# Patient Record
Sex: Female | Born: 1940
Health system: Southern US, Community
[De-identification: ages and names within clinical notes are randomized; demographics above are authoritative.]

## PROBLEM LIST (undated history)

## (undated) DIAGNOSIS — H269 Unspecified cataract: Secondary | ICD-10-CM

## (undated) DIAGNOSIS — M1990 Unspecified osteoarthritis, unspecified site: Secondary | ICD-10-CM

## (undated) DIAGNOSIS — N189 Chronic kidney disease, unspecified: Secondary | ICD-10-CM

## (undated) HISTORY — DX: Unspecified cataract: H26.9

## (undated) HISTORY — PX: CATARACT EXTRACTION: SUR2

---

## 1998-07-06 ENCOUNTER — Other Ambulatory Visit: Admission: RE | Admit: 1998-07-06 | Discharge: 1998-07-06 | Payer: Self-pay | Admitting: Obstetrics & Gynecology

## 1999-09-30 ENCOUNTER — Other Ambulatory Visit: Admission: RE | Admit: 1999-09-30 | Discharge: 1999-09-30 | Payer: Self-pay | Admitting: Obstetrics & Gynecology

## 2000-01-10 ENCOUNTER — Encounter: Payer: Self-pay | Admitting: Internal Medicine

## 2000-01-10 ENCOUNTER — Ambulatory Visit (HOSPITAL_COMMUNITY): Admission: RE | Admit: 2000-01-10 | Discharge: 2000-01-10 | Payer: Self-pay | Admitting: Internal Medicine

## 2000-11-28 ENCOUNTER — Other Ambulatory Visit: Admission: RE | Admit: 2000-11-28 | Discharge: 2000-11-28 | Payer: Self-pay | Admitting: Obstetrics & Gynecology

## 2001-09-26 ENCOUNTER — Encounter (INDEPENDENT_AMBULATORY_CARE_PROVIDER_SITE_OTHER): Payer: Self-pay | Admitting: Specialist

## 2001-09-26 ENCOUNTER — Ambulatory Visit (HOSPITAL_COMMUNITY): Admission: RE | Admit: 2001-09-26 | Discharge: 2001-09-26 | Payer: Self-pay | Admitting: Orthopedic Surgery

## 2001-12-18 ENCOUNTER — Other Ambulatory Visit: Admission: RE | Admit: 2001-12-18 | Discharge: 2001-12-18 | Payer: Self-pay | Admitting: Obstetrics & Gynecology

## 2002-02-06 ENCOUNTER — Ambulatory Visit (HOSPITAL_COMMUNITY): Admission: RE | Admit: 2002-02-06 | Discharge: 2002-02-06 | Payer: Self-pay | Admitting: Gastroenterology

## 2003-02-23 ENCOUNTER — Other Ambulatory Visit: Admission: RE | Admit: 2003-02-23 | Discharge: 2003-02-23 | Payer: Self-pay | Admitting: Obstetrics & Gynecology

## 2003-07-07 ENCOUNTER — Ambulatory Visit (HOSPITAL_COMMUNITY): Admission: RE | Admit: 2003-07-07 | Discharge: 2003-07-07 | Payer: Self-pay | Admitting: Orthopaedic Surgery

## 2003-07-07 ENCOUNTER — Ambulatory Visit (HOSPITAL_BASED_OUTPATIENT_CLINIC_OR_DEPARTMENT_OTHER): Admission: RE | Admit: 2003-07-07 | Discharge: 2003-07-07 | Payer: Self-pay | Admitting: Orthopaedic Surgery

## 2004-04-06 ENCOUNTER — Ambulatory Visit: Payer: Self-pay | Admitting: Internal Medicine

## 2004-07-07 ENCOUNTER — Other Ambulatory Visit: Admission: RE | Admit: 2004-07-07 | Discharge: 2004-07-07 | Payer: Self-pay | Admitting: Obstetrics & Gynecology

## 2004-07-15 ENCOUNTER — Ambulatory Visit: Payer: Self-pay | Admitting: Internal Medicine

## 2007-05-14 ENCOUNTER — Ambulatory Visit: Payer: Self-pay | Admitting: Internal Medicine

## 2007-05-14 DIAGNOSIS — J019 Acute sinusitis, unspecified: Secondary | ICD-10-CM | POA: Insufficient documentation

## 2007-05-14 DIAGNOSIS — E785 Hyperlipidemia, unspecified: Secondary | ICD-10-CM | POA: Insufficient documentation

## 2007-10-03 ENCOUNTER — Ambulatory Visit: Payer: Self-pay | Admitting: Internal Medicine

## 2007-10-03 DIAGNOSIS — M1990 Unspecified osteoarthritis, unspecified site: Secondary | ICD-10-CM

## 2007-10-03 DIAGNOSIS — J309 Allergic rhinitis, unspecified: Secondary | ICD-10-CM | POA: Insufficient documentation

## 2007-10-03 DIAGNOSIS — H9190 Unspecified hearing loss, unspecified ear: Secondary | ICD-10-CM | POA: Insufficient documentation

## 2007-10-12 ENCOUNTER — Ambulatory Visit: Payer: Self-pay | Admitting: Family Medicine

## 2007-10-12 DIAGNOSIS — B0239 Other herpes zoster eye disease: Secondary | ICD-10-CM | POA: Insufficient documentation

## 2007-11-01 ENCOUNTER — Ambulatory Visit: Payer: Self-pay | Admitting: Internal Medicine

## 2007-11-01 DIAGNOSIS — R21 Rash and other nonspecific skin eruption: Secondary | ICD-10-CM

## 2007-11-21 ENCOUNTER — Telehealth: Payer: Self-pay | Admitting: Internal Medicine

## 2008-01-10 ENCOUNTER — Ambulatory Visit: Payer: Self-pay | Admitting: Internal Medicine

## 2008-01-10 DIAGNOSIS — R51 Headache: Secondary | ICD-10-CM

## 2008-01-10 DIAGNOSIS — R519 Headache, unspecified: Secondary | ICD-10-CM | POA: Insufficient documentation

## 2008-01-13 LAB — CONVERTED CEMR LAB
BUN: 18 mg/dL (ref 6–23)
Basophils Absolute: 0 10*3/uL (ref 0.0–0.1)
Basophils Relative: 0.8 % (ref 0.0–3.0)
Bilirubin Urine: NEGATIVE
CO2: 29 meq/L (ref 19–32)
Calcium: 8.9 mg/dL (ref 8.4–10.5)
Chloride: 104 meq/L (ref 96–112)
Creatinine, Ser: 1 mg/dL (ref 0.4–1.2)
Eosinophils Absolute: 0.1 10*3/uL (ref 0.0–0.7)
Eosinophils Relative: 1.7 % (ref 0.0–5.0)
GFR calc Af Amer: 71 mL/min
GFR calc non Af Amer: 58 mL/min
Glucose, Bld: 86 mg/dL (ref 70–99)
HCT: 36.7 % (ref 36.0–46.0)
Hemoglobin: 12.8 g/dL (ref 12.0–15.0)
Ketones, ur: NEGATIVE mg/dL
Leukocytes, UA: NEGATIVE
Lymphocytes Relative: 33.2 % (ref 12.0–46.0)
MCHC: 34.9 g/dL (ref 30.0–36.0)
MCV: 88.9 fL (ref 78.0–100.0)
Monocytes Absolute: 0.7 10*3/uL (ref 0.1–1.0)
Monocytes Relative: 12.9 % — ABNORMAL HIGH (ref 3.0–12.0)
Neutro Abs: 2.7 10*3/uL (ref 1.4–7.7)
Neutrophils Relative %: 51.4 % (ref 43.0–77.0)
Nitrite: NEGATIVE
Platelets: 216 10*3/uL (ref 150–400)
Potassium: 3.8 meq/L (ref 3.5–5.1)
RBC: 4.13 M/uL (ref 3.87–5.11)
RDW: 13.5 % (ref 11.5–14.6)
Sed Rate: 18 mm/hr (ref 0–22)
Sodium: 140 meq/L (ref 135–145)
Specific Gravity, Urine: <=1.005 (ref 1.000–1.03)
TSH: 0.96 microintl units/mL (ref 0.35–5.50)
Total Protein, Urine: NEGATIVE mg/dL
Urine Glucose: NEGATIVE mg/dL
Urobilinogen, UA: 0.2 (ref 0.0–1.0)
WBC: 5.3 10*3/uL (ref 4.5–10.5)
pH: 6 (ref 5.0–8.0)

## 2008-03-25 ENCOUNTER — Encounter: Payer: Self-pay | Admitting: Internal Medicine

## 2008-05-29 ENCOUNTER — Ambulatory Visit: Payer: Self-pay | Admitting: Internal Medicine

## 2008-05-29 DIAGNOSIS — M79609 Pain in unspecified limb: Secondary | ICD-10-CM | POA: Insufficient documentation

## 2008-05-29 DIAGNOSIS — M25559 Pain in unspecified hip: Secondary | ICD-10-CM | POA: Insufficient documentation

## 2008-05-29 DIAGNOSIS — N3090 Cystitis, unspecified without hematuria: Secondary | ICD-10-CM | POA: Insufficient documentation

## 2008-06-02 LAB — CONVERTED CEMR LAB
BUN: 32 mg/dL — ABNORMAL HIGH (ref 6–23)
Basophils Absolute: 0 10*3/uL (ref 0.0–0.1)
Basophils Relative: 0.1 % (ref 0.0–3.0)
CO2: 27 meq/L (ref 19–32)
Calcium: 9.2 mg/dL (ref 8.4–10.5)
Chloride: 102 meq/L (ref 96–112)
Creatinine, Ser: 1.2 mg/dL (ref 0.4–1.2)
Crystals: NEGATIVE
Eosinophils Absolute: 0.1 10*3/uL (ref 0.0–0.7)
Eosinophils Relative: 1.8 % (ref 0.0–5.0)
Folate: 19 ng/mL
GFR calc Af Amer: 57 mL/min
GFR calc non Af Amer: 47 mL/min
Glucose, Bld: 80 mg/dL (ref 70–99)
HCT: 40 % (ref 36.0–46.0)
Hemoglobin: 13.9 g/dL (ref 12.0–15.0)
Ketones, ur: >=80 mg/dL — AB
Leukocytes, UA: NEGATIVE
Lymphocytes Relative: 30.6 % (ref 12.0–46.0)
MCHC: 34.8 g/dL (ref 30.0–36.0)
MCV: 89.7 fL (ref 78.0–100.0)
Monocytes Absolute: 0.5 10*3/uL (ref 0.1–1.0)
Monocytes Relative: 10.5 % (ref 3.0–12.0)
Neutro Abs: 2.9 10*3/uL (ref 1.4–7.7)
Neutrophils Relative %: 57 % (ref 43.0–77.0)
Nitrite: NEGATIVE
Platelets: 210 10*3/uL (ref 150–400)
Potassium: 4.8 meq/L (ref 3.5–5.1)
RBC: 4.46 M/uL (ref 3.87–5.11)
RDW: 12.7 % (ref 11.5–14.6)
Sed Rate: 18 mm/hr (ref 0–22)
Sodium: 138 meq/L (ref 135–145)
Specific Gravity, Urine: >=1.03 (ref 1.000–1.03)
TSH: 0.95 microintl units/mL (ref 0.35–5.50)
Total Protein, Urine: NEGATIVE mg/dL
Urine Glucose: NEGATIVE mg/dL
Urobilinogen, UA: 0.2 (ref 0.0–1.0)
Vitamin B-12: 272 pg/mL (ref 211–911)
WBC: 5.1 10*3/uL (ref 4.5–10.5)
pH: 5 (ref 5.0–8.0)

## 2008-06-04 ENCOUNTER — Telehealth: Payer: Self-pay | Admitting: Internal Medicine

## 2008-06-18 ENCOUNTER — Ambulatory Visit: Payer: Self-pay | Admitting: Internal Medicine

## 2008-07-06 ENCOUNTER — Ambulatory Visit: Payer: Self-pay | Admitting: Internal Medicine

## 2008-07-06 ENCOUNTER — Telehealth: Payer: Self-pay | Admitting: Internal Medicine

## 2008-07-06 LAB — CONVERTED CEMR LAB
Bilirubin Urine: NEGATIVE
Ketones, ur: NEGATIVE mg/dL
Leukocytes, UA: NEGATIVE
Nitrite: NEGATIVE
Specific Gravity, Urine: 1.025 (ref 1.000–1.03)
Total Protein, Urine: NEGATIVE mg/dL
Urine Glucose: NEGATIVE mg/dL
Urobilinogen, UA: 0.2 (ref 0.0–1.0)
pH: 5 (ref 5.0–8.0)

## 2008-07-07 ENCOUNTER — Telehealth: Payer: Self-pay | Admitting: Internal Medicine

## 2008-07-31 ENCOUNTER — Telehealth: Payer: Self-pay | Admitting: Internal Medicine

## 2008-07-31 ENCOUNTER — Ambulatory Visit: Payer: Self-pay | Admitting: Internal Medicine

## 2008-08-04 ENCOUNTER — Encounter: Payer: Self-pay | Admitting: Internal Medicine

## 2008-08-04 LAB — CONVERTED CEMR LAB
Bilirubin Urine: NEGATIVE
Ketones, ur: NEGATIVE mg/dL
Nitrite: NEGATIVE
Specific Gravity, Urine: >=1.03 (ref 1.000–1.030)
Total Protein, Urine: NEGATIVE mg/dL
Urine Glucose: NEGATIVE mg/dL
Urobilinogen, UA: 0.2 (ref 0.0–1.0)
pH: 5.5 (ref 5.0–8.0)

## 2008-09-25 ENCOUNTER — Ambulatory Visit: Payer: Self-pay | Admitting: Internal Medicine

## 2009-01-27 ENCOUNTER — Ambulatory Visit: Payer: Self-pay | Admitting: Internal Medicine

## 2009-01-27 LAB — CONVERTED CEMR LAB: Vit D, 25-Hydroxy: 35 ng/mL (ref 30–89)

## 2009-01-31 LAB — CONVERTED CEMR LAB
BUN: 20 mg/dL (ref 6–23)
CO2: 26 meq/L (ref 19–32)
Calcium: 8.9 mg/dL (ref 8.4–10.5)
Chloride: 105 meq/L (ref 96–112)
Creatinine, Ser: 1.1 mg/dL (ref 0.4–1.2)
GFR calc non Af Amer: 52.16 mL/min (ref >60)
Glucose, Bld: 78 mg/dL (ref 70–99)
Potassium: 4.2 meq/L (ref 3.5–5.1)
Sodium: 139 meq/L (ref 135–145)
Vitamin B-12: 291 pg/mL (ref 211–911)

## 2009-02-01 ENCOUNTER — Ambulatory Visit: Payer: Self-pay | Admitting: Internal Medicine

## 2009-02-01 DIAGNOSIS — E538 Deficiency of other specified B group vitamins: Secondary | ICD-10-CM

## 2009-02-01 DIAGNOSIS — R002 Palpitations: Secondary | ICD-10-CM | POA: Insufficient documentation

## 2010-03-07 ENCOUNTER — Ambulatory Visit: Payer: Self-pay | Admitting: Internal Medicine

## 2010-03-07 DIAGNOSIS — M545 Low back pain, unspecified: Secondary | ICD-10-CM | POA: Insufficient documentation

## 2010-03-07 LAB — CONVERTED CEMR LAB
Anti Nuclear Antibody(ANA): NEGATIVE
Rhuematoid fact SerPl-aCnc: <20 intl units/mL (ref 0–20)

## 2010-03-09 LAB — CONVERTED CEMR LAB
Basophils Absolute: 0.1 10*3/uL (ref 0.0–0.1)
Basophils Relative: 1.6 % (ref 0.0–3.0)
Eosinophils Absolute: 0.1 10*3/uL (ref 0.0–0.7)
Eosinophils Relative: 3.4 % (ref 0.0–5.0)
HCT: 38.4 % (ref 36.0–46.0)
Hemoglobin: 13.2 g/dL (ref 12.0–15.0)
Lymphocytes Relative: 31.4 % (ref 12.0–46.0)
Lymphs Abs: 1.3 10*3/uL (ref 0.7–4.0)
MCHC: 34.5 g/dL (ref 30.0–36.0)
MCV: 91.2 fL (ref 78.0–100.0)
Monocytes Absolute: 0.4 10*3/uL (ref 0.1–1.0)
Monocytes Relative: 10.1 % (ref 3.0–12.0)
Neutro Abs: 2.3 10*3/uL (ref 1.4–7.7)
Neutrophils Relative %: 53.5 % (ref 43.0–77.0)
Platelets: 186 10*3/uL (ref 150.0–400.0)
RBC: 4.22 M/uL (ref 3.87–5.11)
RDW: 14 % (ref 11.5–14.6)
Sed Rate: 14 mm/hr (ref 0–22)
Vitamin B-12: 825 pg/mL (ref 211–911)
WBC: 4.2 10*3/uL — ABNORMAL LOW (ref 4.5–10.5)

## 2010-03-15 ENCOUNTER — Encounter: Payer: Self-pay | Admitting: Internal Medicine

## 2010-05-23 ENCOUNTER — Telehealth: Payer: Self-pay | Admitting: Internal Medicine

## 2010-05-23 ENCOUNTER — Other Ambulatory Visit: Payer: Self-pay | Admitting: Internal Medicine

## 2010-05-23 ENCOUNTER — Ambulatory Visit
Admission: RE | Admit: 2010-05-23 | Discharge: 2010-05-23 | Payer: Self-pay | Source: Home / Self Care | Attending: Internal Medicine | Admitting: Internal Medicine

## 2010-05-23 LAB — URINALYSIS, ROUTINE W REFLEX MICROSCOPIC
Bilirubin Urine: NEGATIVE
Ketones, ur: NEGATIVE
Nitrite: NEGATIVE
Specific Gravity, Urine: 1.02 (ref 1.000–1.030)
Total Protein, Urine: NEGATIVE
Urine Glucose: NEGATIVE
Urobilinogen, UA: 0.2 (ref 0.0–1.0)
pH: 6 (ref 5.0–8.0)

## 2010-06-14 NOTE — Assessment & Plan Note (Signed)
Summary: PAINFUL ARTHRITIS/#/CD   Vital Signs:  Patient profile:   70 year old female Height:      64 inches Weight:      136 pounds BMI:     23.43 Temp:     97.8 degrees F oral Pulse rate:   76 / minute Pulse rhythm:   regular Resp:     16 per minute BP sitting:   112 / 70  (left arm) Cuff size:   regular  Vitals Entered By: Lanier Prude, Beverly Gust) (March 07, 2010 9:47 AM) CC: arthritis pain worse in hips Is Patient Diabetic? No Comments pt is not taking Triamcinolone or Pennsaid   CC:  arthritis pain worse in hips.  History of Present Illness: C/o arthralgia and stiffness in hips, LBP 10/10 x 6 months. Much less so with shoulders and neck.  Current Medications (verified): 1)  Premarin 0.9 Mg  Tabs (Estrogens Conjugated) .Marland Kitchen.. 1 By Mouth Qd 2)  Fexofenadine Hcl 180 Mg Tabs (Fexofenadine Hcl) .Marland Kitchen.. 1 Once Daily As Needed Allergies 3)  Fluticasone Propionate 50 Mcg/act  Susp (Fluticasone Propionate) .... 2 Sprays Each Nostril Once Daily 4)  Triamcinolone Acetonide 0.5 % Crea (Triamcinolone Acetonide) .... Apply Bid To Affected Area 5)  Vitamin B-12 Cr 1000 Mcg  Tbcr (Cyanocobalamin) .... Take One Tablet By Mouth Daily 6)  Vitamin D3 1000 Unit  Tabs (Cholecalciferol) .Marland Kitchen.. 1 By Mouth Daily 7)  Aleve 220 Mg Tabs (Naproxen Sodium) .... Two Times A Day 8)  Pennsaid 1.5 % Soln (Diclofenac Sodium) .... 3-5 Gtt To Skin Three Times Daily  Allergies (verified): 1)  ! Tetracycline 2)  ! Macrodantin  Past History:  Past Medical History: Last updated: 02/01/2009 Hyperlipidemia Osteoarthritis Dr Jennette Kettle Allergic rhinitis L face H zoster 2009 (?), more likely D. herpetiformis - resolved on Gluten free diet Low Vit B12 2009 and low Vit D  Social History: Last updated: 02/01/2009 Never Smoked Alcohol use-yes Married Regular exercise-yes - golf and stretching  Past Surgical History: Hysterectomy - complete s/p hand cyst  Review of Systems  The patient denies fever,  dyspnea on exertion, and abdominal pain.    Physical Exam  General:  F/u on hip pain and rash on face Head:  tender over L max sinus no pulsating vessels Nose:  External nasal examination shows no deformity or inflammation. Nasal mucosa are pink and moist without lesions or exudates. Mouth:  Oral mucosa and oropharynx without lesions or exudates.  Teeth in good repair. Neck:  No deformities, masses, or tenderness noted. Lungs:  Normal respiratory effort, chest expands symmetrically. Lungs are clear to auscultation, no crackles or wheezes. Heart:  Normal rate and regular rhythm. S1 and S2 normal without gallop, murmur, click, rub or other extra sounds. Abdomen:  Bowel sounds positive,abdomen soft and non-tender without masses, organomegaly or hernias noted. Msk:  hips  B tender Lumbar-sacral spine is tender to palpation over paraspinal muscles and painfull with the ROM  groin is tender B Pulses:  R and L carotid,radial,femoral,dorsalis pedis and posterior tibial pulses are full and equal bilaterally Neurologic:  Strai leg elev (-) B Skin:  Clear Psych:  Cognition and judgment appear intact. Alert and cooperative with normal attention span and concentration. No apparent delusions, illusions, hallucinations   Impression & Recommendations:  Problem # 1:  HIP PAIN (ICD-719.45) B MSK, r/o CTD Assessment Deteriorated  The following medications were removed from the medication list:    Aleve 220 Mg Tabs (Naproxen sodium) .Marland Kitchen..Marland Kitchen Two times a day Her  updated medication list for this problem includes:    Naproxen 500 Mg Tabs (Naproxen) .Marland Kitchen... 1 by mouth two times a day pc for pain/arthritis    Tramadol Hcl 50 Mg Tabs (Tramadol hcl) .Marland Kitchen... 1-2 tabs by mouth two times a day as needed pain  Orders: TLB-B12, Serum-Total ONLY (56213-Y86) TLB-Sedimentation Rate (ESR) (85652-ESR) Antinuclear Antib (ANA) [phone]) T-RA (Rheumatoid Factor) ([id number]) TLB-CBC Platelet - w/Differential  (85025-CBCD) Rheumatology Referral (Rheumatology)  Problem # 2:  LOW BACK PAIN, ACUTE (ICD-724.2) Assessment: Deteriorated  The following medications were removed from the medication list:    Aleve 220 Mg Tabs (Naproxen sodium) .Marland Kitchen..Marland Kitchen Two times a day Her updated medication list for this problem includes:    Naproxen 500 Mg Tabs (Naproxen) .Marland Kitchen... 1 by mouth two times a day pc for pain/arthritis    Tramadol Hcl 50 Mg Tabs (Tramadol hcl) .Marland Kitchen... 1-2 tabs by mouth two times a day as needed pain  Orders: Antinuclear Antib (ANA) [phone]) T-RA (Rheumatoid Factor) [phone]) Rheumatology Referral (Rheumatology) TLB-B12, Serum-Total ONLY (38756-E33) TLB-Sedimentation Rate (ESR) (85652-ESR) TLB-CBC Platelet - w/Differential (85025-CBCD)  Problem # 3:  VITAMIN B12 DEFICIENCY (ICD-266.2) Assessment: Unchanged On the regimen of medicine(s) reflected in the chart   Orders: Antinuclear Antib (ANA) [phone]) T-RA (Rheumatoid Factor) (06301-60109) TLB-B12, Serum-Total ONLY (32355-D32) TLB-Sedimentation Rate (ESR) (85652-ESR) TLB-CBC Platelet - w/Differential (85025-CBCD)  Complete Medication List: 1)  Premarin 0.9 Mg Tabs (Estrogens conjugated) .Marland Kitchen.. 1 by mouth qd 2)  Fexofenadine Hcl 180 Mg Tabs (Fexofenadine hcl) .Marland Kitchen.. 1 once daily as needed allergies 3)  Fluticasone Propionate 50 Mcg/act Susp (Fluticasone propionate) .... 2 sprays each nostril once daily 4)  Triamcinolone Acetonide 0.5 % Crea (Triamcinolone acetonide) .... Apply bid to affected area 5)  Vitamin B-12 Cr 1000 Mcg Tbcr (Cyanocobalamin) .... Take one tablet by mouth daily 6)  Vitamin D3 1000 Unit Tabs (Cholecalciferol) .Marland Kitchen.. 1 by mouth daily 7)  Pennsaid 1.5 % Soln (Diclofenac sodium) .... 3-5 gtt to skin three times daily 8)  Naproxen 500 Mg Tabs (Naproxen) .Marland Kitchen.. 1 by mouth two times a day pc for pain/arthritis 9)  Amoxicillin 500 Mg Caps (Amoxicillin) .... 2 caps by mouth bid 10)  Tramadol Hcl 50 Mg Tabs (Tramadol hcl)  .Marland Kitchen.. 1-2 tabs by mouth two times a day as needed pain  Patient Instructions: 1)  Please schedule a follow-up appointment in 1 month. 2)  Go on Youtube ([email or web address]) and look up "piriformis stretch", "Ileopsoas stretch", "IT band stretch" and "gluteus stretch". See the anatomy and learn the symptoms.  You can try to self-diagnose. Do the stretches - it may help!  Prescriptions: TRAMADOL HCL 50 MG TABS (TRAMADOL HCL) 1-2 tabs by mouth two times a day as needed pain  #120 x 3   Entered and Authorized by:   Tresa Garter MD   Signed by:   Tresa Garter MD on 03/07/2010   Method used:   Print then Give to Patient   RxID:   [id number] AMOXICILLIN 500 MG CAPS (AMOXICILLIN) 2 caps by mouth bid  #40 x 0   Entered and Authorized by:   Tresa Garter MD   Signed by:   Tresa Garter MD on 03/07/2010   Method used:   Print then Give to Patient   RxID:   [id number] NAPROXEN 500 MG TABS (NAPROXEN) 1 by mouth two times a day pc for pain/arthritis  #60 x 3   Entered and Authorized by:   Tresa Garter MD   Signed by:  Tresa Garter MD on 03/07/2010   Method used:   Print then Give to Patient   RxID:   [id number]    Orders Added: 1)  Antinuclear Antib (ANA) [[id number]] 2)  T-RA (Rheumatoid Factor) [30865-78469] 3)  Est. Patient Level IV [62952] 4)  Rheumatology Referral [Rheumatology] 5)  TLB-B12, Serum-Total ONLY [82607-B12] 6)  TLB-Sedimentation Rate (ESR) [85652-ESR] 7)  TLB-CBC Platelet - w/Differential [85025-CBCD]   Not Administered:    Influenza Vaccine not given due to: declined

## 2010-06-14 NOTE — Consult Note (Signed)
Summary: Stacey Drain MD  Stacey Drain MD   Imported By: Lennie Odor 03/30/2010 10:13:11  _____________________________________________________________________  External Attachment:    Type:   Image     Comment:   External Document

## 2010-06-16 NOTE — Progress Notes (Signed)
Summary: ?bladder inf  Phone Note Call from Patient Call back at Home Phone [phone]   Caller: Patient Call For: Tresa Garter MD Summary of Call: Pt requests to come in today for urine specimen for possible bladder infection?  Initial call taken by: Verdell Face,  May 23, 2010 8:45 AM  Follow-up for Phone Call        ok UA 595.0 Follow-up by: Tresa Garter MD,  May 23, 2010 1:09 PM  Additional Follow-up for Phone Call Additional follow up Details #1::        pt informed/order entered in IDX. Additional Follow-up by: Lanier Prude, [facility]),  May 23, 2010 1:14 PM

## 2010-09-27 NOTE — Assessment & Plan Note (Signed)
[facility]                                 ON-CALL NOTE   Amanda, Cole                        MRN:          [id number]  DATE:10/12/2007                            DOB:          07-Dec-1940    PHONE NUMBER:  [phone].   PRIMARY PHYSICIAN:  Georgina Quint. Plotnikov, MD   SUBJECTIVE:  Rash on forehead, possible shingles.   ASSESSMENT AND PLAN:  [facility].     Kerby Nora, MD  Electronically Signed    AB/MedQ  DD: 10/12/2007  DT: 10/12/2007  Job #: [id number]

## 2010-09-30 NOTE — Op Note (Signed)
[facility]  Patient:    Amanda Cole, Amanda Cole Visit Number: [id number] MRN: [id number]          Service Type: DSU Location: DAY Attending Physician:  Marlowe Kays Page Dictated by:   Illene Labrador. Aplington, M.D. Proc. Date: 09/26/01 Admit Date:  09/26/2001                             Operative Report  PREOPERATIVE DIAGNOSIS:  Painful Dupuytren nodules, left palm.  POSTOPERATIVE DIAGNOSIS:  Painful Dupuytren nodules, left palm.  OPERATION PERFORMED:  Excision of multiple Dupuytrens nodules, left palm.  SURGEON:  Illene Labrador. Aplington, M.D.  ASSISTANT:  Nurse.  ANESTHESIA:  General.  PATHOLOGY AND JUSTIFICATION FOR PROCEDURE:  She had one nodule in particular which has been painful for her with several other areas of prominent thickening. She has been unable to use the hand for activities such as golf because of this.  DESCRIPTION OF PROCEDURE:  Satisfactory general anesthesia, pneumatic tourniquet with the arm esmarched out and the hand and wrist prepped with Duraprep and draped in a sterile field. I started out with an incision essentially elongating the distal portion of a carpal tunnel incision and was able to excise all the nodules which were to either side of the incision through the approach without having to go to any flaps. The underlying tendinous and neurovascular structures were identified and carefully protected and the palmar fibromatosis dissected out mainly with hemostat and then the areas of fibromatosis cut out with applying scissors. I worked on both sides of the incision and at the conclusion of the case, all areas that appeared to be a problem preoperatively had been excised. Along the way, potential bleeders were coagulated with bipolar cautery. The wound was then irrigated with sterile saline and the wound margins infiltrated with 0.5% plain Marcaine. The skin and subcutaneous tissue only were closed with interrupted 4-0 nylon  mattress sutures. Betadine Adaptic and a small compressive dressing were applied. The tourniquet was released, she tolerated the procedure well and at the time of this dictation was on her way to the recovery room in satisfactory condition with no known complications. Dictated by:   Illene Labrador. Aplington, M.D. Attending Physician:  Joaquin Courts DD:  09/26/01 TD:  09/27/01 Job: [id number]

## 2010-09-30 NOTE — Op Note (Signed)
NAMEFRAN, NEISWONGER                           ACCOUNT NO.:  [id number]   MEDICAL RECORD NO.:  [id number]                   PATIENT TYPE:  AMB   LOCATION:  DSC                                  FACILITY:  [facility]   PHYSICIAN:  Claude Manges. Cleophas Dunker, M.D.            DATE OF BIRTH:  [birth year]   DATE OF PROCEDURE:  07/07/2003  DATE OF DISCHARGE:                                 OPERATIVE REPORT   PREOPERATIVE DIAGNOSIS:  Impingement, right shoulder.   POSTOPERATIVE DIAGNOSIS:  Impingement, right shoulder with SLAP 2 lesion of  anterior glenoid labrum.   OPERATION PERFORMED:   SURGEON:  Claude Manges. Cleophas Dunker, M.D.   ASSISTANT:   ANESTHESIA:  General orotracheal anesthesia with supplemental interscalene  nerve block.   COMPLICATIONS:  None.   INDICATIONS FOR PROCEDURE:  The patient is a 70 year old female who has been  experiencing right shoulder pain for many months.  She has been evaluated  with a clinical diagnosis of impingement.  She has not responded to  subacromial cortisone injections and physical therapy and continues to have  pain to the point of compromise.  She has had difficulty sleeping and  performing activities overhead.  She had had an MRI scan that reveals  partial surface tearing of the distal supraspinatus tendon and subscapularis  tendinosis.  There was no evidence of a full thickness rotator cuff tear.  There was a type 2 acromion with moderate lateral downsloping.  She is now  to have arthroscopic evaluation.   DESCRIPTION OF PROCEDURE:  With the patient comfortable on the operating  table under general orotracheal anesthesia with a supplemental interscalene  nerve block.  The patient was placed in a semisitting position with a  shoulder frame.  The right shoulder which would have been appropriately  identified as the operative site preoperatively was prepped with DuraPrep  from the base of the neck circumferentially below the elbow.  Sterile  draping was  performed.   A marking pin was used to outline the acromion, the acromioclavicular joint  and the coracoid.  At a point a fingerbreadth posterior and medial to the  posterior angle of the acromion, a small stab wound was made prior to which  0.25% Marcaine with epinephrine was injected.  The arthroscope was easily  placed into the shoulder joint with no evidence of any loose material or  significant synovitis.  There was no chondromalacia of the glenoid at the  humeral head.  The biceps tendon appeared to be intact.  I did not see  evidence of a partial or full rotator cuff tear.  There was, however, a SLAP  2 lesion of the anterior glenoid labrum, extending from about the 2 o'clock  position to the biceps anchor.  It was easily mobile from the glenoid  labrum.  Accordingly a repair of the anterior glenoid labrum was performed.  The 6 mm cannula was then inserted through a separate  stab wound anteriorly.  The Arthrex tissue tack was utilized.  The 4 mm Vortex bur was inserted to  roughen the bone surface at the anterior glenoid and the tissue tack  instruments were then carefully inserted.  A drill was then placed over a  guide pin and the 30 degree angle tack was then placed through the very  center of the repair.  I had an excellent repair, it was nice and tight and  the entire labrum was easily juxtaposed to the eburnated glenoid.  I  carefully probed it and it was perfectly stable.  There was no evidence of  any further abnormality.   The cannula was then removed and a 4 mm cannula placed in the subacromial  space anteriorly. A third portal was established in the lateral subacromial  space.  An anterior inferior acromioplasty was performed associated with the  subacromial decompression through the arthroscope.  There was obvious  overhang of the anterolateral acromion impinging the supraspinatus and part  of the infraspinatus tendon.  These were carefully debrided so that I had a   very nice space and no further impingement.  I do see evidence of a full  thickness rotator cuff tear.  The subacromial space was carefully irrigated  with saline solution.  Three puncture sites were closed with interrupted 4-0  Ethilon and infiltrated with 0.25% Marcaine with epinephrine.  Sterile bulky  dressing was applied followed by a sling.   PLAN:  Percocet for pain. Outpatient office one week.                                               Claude Manges. Cleophas Dunker, M.D.    PWW/MEDQ  D:  07/07/2003  T:  07/07/2003  Job:  [id number]

## 2011-03-13 ENCOUNTER — Other Ambulatory Visit: Payer: Self-pay | Admitting: Internal Medicine

## 2011-05-30 ENCOUNTER — Telehealth: Payer: Self-pay

## 2011-05-30 NOTE — Telephone Encounter (Signed)
Ok UA Take OTC Azo prn Thx

## 2011-05-30 NOTE — Telephone Encounter (Signed)
Pt called c/o urinary urgency. Pt is requesting order for UA, okay to enter?

## 2011-08-31 NOTE — Telephone Encounter (Signed)
UA performed and resulted... Routed U/A result to Dr. Posey Rea.

## 2012-02-29 ENCOUNTER — Ambulatory Visit (INDEPENDENT_AMBULATORY_CARE_PROVIDER_SITE_OTHER): Payer: Medicare Other | Admitting: Internal Medicine

## 2012-02-29 ENCOUNTER — Encounter: Payer: Self-pay | Admitting: Internal Medicine

## 2012-02-29 VITALS — BP 122/80 | HR 76 | Temp 97.3°F | Resp 16 | Wt 136.0 lb

## 2012-02-29 DIAGNOSIS — J019 Acute sinusitis, unspecified: Secondary | ICD-10-CM

## 2012-02-29 DIAGNOSIS — Z2911 Encounter for prophylactic immunotherapy for respiratory syncytial virus (RSV): Secondary | ICD-10-CM

## 2012-02-29 DIAGNOSIS — Z0000 Encounter for general adult medical examination without abnormal findings: Secondary | ICD-10-CM | POA: Insufficient documentation

## 2012-02-29 DIAGNOSIS — M79609 Pain in unspecified limb: Secondary | ICD-10-CM

## 2012-02-29 DIAGNOSIS — Z23 Encounter for immunization: Secondary | ICD-10-CM

## 2012-02-29 DIAGNOSIS — M79642 Pain in left hand: Secondary | ICD-10-CM | POA: Insufficient documentation

## 2012-02-29 DIAGNOSIS — E538 Deficiency of other specified B group vitamins: Secondary | ICD-10-CM

## 2012-02-29 DIAGNOSIS — M1990 Unspecified osteoarthritis, unspecified site: Secondary | ICD-10-CM

## 2012-02-29 DIAGNOSIS — E785 Hyperlipidemia, unspecified: Secondary | ICD-10-CM

## 2012-02-29 DIAGNOSIS — J309 Allergic rhinitis, unspecified: Secondary | ICD-10-CM

## 2012-02-29 MED ORDER — AMOXICILLIN 500 MG PO CAPS: 1000.0000 mg | ORAL_CAPSULE | Freq: Two times a day (BID) | ORAL | Status: DC

## 2012-02-29 MED ORDER — VITAMIN B-12 1000 MCG SL SUBL: 1.0000 | SUBLINGUAL_TABLET | Freq: Every day | SUBLINGUAL | Status: DC

## 2012-02-29 MED ORDER — ESTROGENS CONJUGATED 0.625 MG PO TABS: 0.6250 mg | ORAL_TABLET | Freq: Every day | ORAL | Status: DC

## 2012-02-29 MED ORDER — FLUTICASONE PROPIONATE 50 MCG/ACT NA SUSP: 1.0000 | Freq: Every day | NASAL | Status: DC

## 2012-02-29 MED ORDER — VITAMIN D 1000 UNITS PO TABS: 1000.0000 [IU] | ORAL_TABLET | Freq: Every day | ORAL | Status: AC

## 2012-02-29 MED ORDER — NAPROXEN SODIUM 220 MG PO TABS: 220.0000 mg | ORAL_TABLET | Freq: Two times a day (BID) | ORAL | Status: DC

## 2012-02-29 NOTE — Progress Notes (Signed)
  Subjective:    Patient ID: Amanda Cole, female    DOB: [birth year], 71 y.o.   MRN: [id number]  HPI  The patient is here for a wellness exam. The patient has been doing well overall without major physical or psychological issues going on lately.  BP Readings from Last 3 Encounters:  02/29/12 122/80  03/07/10 112/70  02/01/09 120/64   Wt Readings from Last 3 Encounters:  02/29/12 136 lb (61.689 kg)  03/07/10 136 lb (61.689 kg)  02/01/09 139 lb (63.05 kg)       Review of Systems  Constitutional: Negative for chills, activity change, appetite change, fatigue and unexpected weight change.  HENT: Positive for congestion. Negative for mouth sores and sinus pressure.   Eyes: Negative for visual disturbance.  Respiratory: Negative for cough and chest tightness.   Gastrointestinal: Negative for nausea and abdominal pain.  Genitourinary: Negative for frequency, difficulty urinating and vaginal pain.  Musculoskeletal: Positive for arthralgias. Negative for back pain and gait problem.  Skin: Negative for pallor and rash.  Neurological: Negative for dizziness, tremors, weakness, numbness and headaches.  Psychiatric/Behavioral: Negative for suicidal ideas, confusion and disturbed wake/sleep cycle.       Objective:   Physical Exam  Constitutional: She appears well-developed. No distress.  HENT:  Head: Normocephalic.  Right Ear: External ear normal.  Left Ear: External ear normal.  Nose: Nose normal.  Mouth/Throat: Oropharynx is clear and moist.  Eyes: Conjunctivae normal are normal. Pupils are equal, round, and reactive to light. Right eye exhibits no discharge. Left eye exhibits no discharge.  Neck: Normal range of motion. Neck supple. No JVD present. No tracheal deviation present. No thyromegaly present.  Cardiovascular: Normal rate, regular rhythm and normal heart sounds.   Pulmonary/Chest: No stridor. No respiratory distress. She has no wheezes.  Abdominal: Soft. Bowel sounds  are normal. She exhibits no distension and no mass. There is no tenderness. There is no rebound and no guarding.  Musculoskeletal: She exhibits tenderness. She exhibits no edema.       Fingers w/OA deformities and L thumb base tenderness  Lymphadenopathy:    She has no cervical adenopathy.  Neurological: She displays normal reflexes. No cranial nerve deficit. She exhibits normal muscle tone. Coordination normal.  Skin: No rash noted. No erythema.  Psychiatric: She has a normal mood and affect. Her behavior is normal. Judgment and thought content normal.    Lab Results  Component Value Date   WBC 4.2* 03/07/2010   HGB 13.2 03/07/2010   HCT 38.4 03/07/2010   PLT 186.0 03/07/2010   GLUCOSE 78 01/27/2009   NA 139 01/27/2009   K 4.2 01/27/2009   CL 105 01/27/2009   CREATININE 1.1 01/27/2009   BUN 20 01/27/2009   CO2 26 01/27/2009   TSH 0.95 05/29/2008         Assessment & Plan:

## 2012-02-29 NOTE — Assessment & Plan Note (Signed)
10/13 abd poll longus tendonitis Injection was offered - she will think about it

## 2012-02-29 NOTE — Assessment & Plan Note (Signed)
Continue with current prescription therapy as reflected on the Med list.  

## 2012-02-29 NOTE — Assessment & Plan Note (Signed)

## 2012-02-29 NOTE — Assessment & Plan Note (Signed)
Amoxicillin bid x 10 d

## 2012-02-29 NOTE — Assessment & Plan Note (Signed)
Continue with current OTC therapy as reflected on the Med list.  

## 2012-02-29 NOTE — Assessment & Plan Note (Signed)
Declined statins. 

## 2012-02-29 NOTE — Assessment & Plan Note (Signed)
OA, finger deforming - B hands Naproxen prn

## 2012-03-01 ENCOUNTER — Ambulatory Visit: Payer: Self-pay | Admitting: Internal Medicine

## 2012-03-29 ENCOUNTER — Other Ambulatory Visit (INDEPENDENT_AMBULATORY_CARE_PROVIDER_SITE_OTHER): Payer: Medicare Other

## 2012-03-29 DIAGNOSIS — E785 Hyperlipidemia, unspecified: Secondary | ICD-10-CM

## 2012-03-29 DIAGNOSIS — E538 Deficiency of other specified B group vitamins: Secondary | ICD-10-CM

## 2012-03-29 DIAGNOSIS — J309 Allergic rhinitis, unspecified: Secondary | ICD-10-CM

## 2012-03-29 DIAGNOSIS — J019 Acute sinusitis, unspecified: Secondary | ICD-10-CM

## 2012-03-29 DIAGNOSIS — M1990 Unspecified osteoarthritis, unspecified site: Secondary | ICD-10-CM

## 2012-03-29 DIAGNOSIS — Z0000 Encounter for general adult medical examination without abnormal findings: Secondary | ICD-10-CM

## 2012-03-29 DIAGNOSIS — Z23 Encounter for immunization: Secondary | ICD-10-CM

## 2012-03-29 LAB — URINALYSIS
Bilirubin Urine: NEGATIVE
Hgb urine dipstick: NEGATIVE
Ketones, ur: NEGATIVE
Total Protein, Urine: NEGATIVE
Urine Glucose: NEGATIVE
Urobilinogen, UA: 0.2 (ref 0.0–1.0)

## 2012-03-29 LAB — HEPATIC FUNCTION PANEL
ALT: 14 U/L (ref 0–35)
AST: 19 U/L (ref 0–37)
Albumin: 3.6 g/dL (ref 3.5–5.2)
Alkaline Phosphatase: 53 U/L (ref 39–117)
Total Protein: 6.8 g/dL (ref 6.0–8.3)

## 2012-03-29 LAB — BASIC METABOLIC PANEL
BUN: 17 mg/dL (ref 6–23)
Calcium: 8.6 mg/dL (ref 8.4–10.5)
GFR: 51.16 mL/min — ABNORMAL LOW (ref >60.00)
Glucose, Bld: 85 mg/dL (ref 70–99)
Sodium: 137 mEq/L (ref 135–145)

## 2012-03-29 LAB — CBC WITH DIFFERENTIAL/PLATELET
Basophils Relative: 0.9 % (ref 0.0–3.0)
Eosinophils Absolute: 0.1 10*3/uL (ref 0.0–0.7)
Eosinophils Relative: 2.5 % (ref 0.0–5.0)
HCT: 37.4 % (ref 36.0–46.0)
Lymphs Abs: 1.3 10*3/uL (ref 0.7–4.0)
MCHC: 33.1 g/dL (ref 30.0–36.0)
MCV: 90.3 fl (ref 78.0–100.0)
Monocytes Absolute: 0.5 10*3/uL (ref 0.1–1.0)
Neutrophils Relative %: 57.8 % (ref 43.0–77.0)
Platelets: 183 10*3/uL (ref 150.0–400.0)
WBC: 4.6 10*3/uL (ref 4.5–10.5)

## 2012-03-29 LAB — URIC ACID: Uric Acid, Serum: 6.9 mg/dL (ref 2.4–7.0)

## 2012-03-29 LAB — LIPID PANEL: Cholesterol: 271 mg/dL — ABNORMAL HIGH (ref 0–200)

## 2012-05-02 ENCOUNTER — Telehealth: Payer: Self-pay | Admitting: *Deleted

## 2012-05-02 NOTE — Telephone Encounter (Signed)
Rec fax stating Premarin 0.625 mg will no longer be covered effective 01.01.2014. Alts include Alendronate, Paroxetine and Venlafaxine. Please advise which is appropriate.

## 2012-05-03 NOTE — Telephone Encounter (Signed)
Listed below meds have nothing in common with premarin. She needs to call them for a covered premarin alternative. Alendronate is for osteoporosis and the other two are antidepressants used off label for hot flashes Thx

## 2012-05-03 NOTE — Telephone Encounter (Signed)
Pt aware.

## 2012-07-31 ENCOUNTER — Telehealth: Payer: Self-pay | Admitting: Internal Medicine

## 2012-07-31 NOTE — Telephone Encounter (Signed)
Amanda Cole / Pt calling in regard to Premarin Rx.  Caller went to get refill med and the pharmacy needs pre authorization.  Caller states pharmacy has sent request for authorization.  Please follow up with caller/pharmacy regarding script.  [employer] confirmed in EMR.  THANK YOU.

## 2012-08-19 NOTE — Telephone Encounter (Signed)
Completed PA form faxed on 08/16/12. Will wait for ins response.

## 2012-08-26 NOTE — Telephone Encounter (Signed)
Called [phone] opt 3 then opt 1 to check PA status. Unable to speak to a person. Will try again later.

## 2012-11-29 ENCOUNTER — Other Ambulatory Visit: Payer: Self-pay | Admitting: Rheumatology

## 2012-11-29 DIAGNOSIS — M545 Low back pain: Secondary | ICD-10-CM

## 2012-12-09 ENCOUNTER — Other Ambulatory Visit: Payer: Medicare Other

## 2012-12-12 ENCOUNTER — Ambulatory Visit
Admission: RE | Admit: 2012-12-12 | Discharge: 2012-12-12 | Disposition: A | Discharge status: Home / Self Care | Payer: Medicare Other | Source: Ambulatory Visit | Attending: Rheumatology | Admitting: Rheumatology

## 2012-12-12 DIAGNOSIS — M545 Low back pain: Secondary | ICD-10-CM

## 2013-02-28 ENCOUNTER — Ambulatory Visit: Payer: Medicare Other | Admitting: Internal Medicine

## 2013-03-04 ENCOUNTER — Ambulatory Visit: Payer: Medicare Other | Admitting: Internal Medicine

## 2013-03-25 ENCOUNTER — Ambulatory Visit (INDEPENDENT_AMBULATORY_CARE_PROVIDER_SITE_OTHER): Payer: Medicare Other | Admitting: Internal Medicine

## 2013-03-25 ENCOUNTER — Other Ambulatory Visit (INDEPENDENT_AMBULATORY_CARE_PROVIDER_SITE_OTHER): Payer: Medicare Other

## 2013-03-25 ENCOUNTER — Encounter: Payer: Self-pay | Admitting: Internal Medicine

## 2013-03-25 VITALS — BP 140/80 | HR 73 | Temp 97.3°F | Wt 137.0 lb

## 2013-03-25 DIAGNOSIS — R232 Flushing: Secondary | ICD-10-CM

## 2013-03-25 DIAGNOSIS — Z1239 Encounter for other screening for malignant neoplasm of breast: Secondary | ICD-10-CM

## 2013-03-25 DIAGNOSIS — Z23 Encounter for immunization: Secondary | ICD-10-CM

## 2013-03-25 DIAGNOSIS — M1990 Unspecified osteoarthritis, unspecified site: Secondary | ICD-10-CM

## 2013-03-25 DIAGNOSIS — Z0000 Encounter for general adult medical examination without abnormal findings: Secondary | ICD-10-CM

## 2013-03-25 DIAGNOSIS — E785 Hyperlipidemia, unspecified: Secondary | ICD-10-CM

## 2013-03-25 DIAGNOSIS — M899 Disorder of bone, unspecified: Secondary | ICD-10-CM

## 2013-03-25 DIAGNOSIS — Z1231 Encounter for screening mammogram for malignant neoplasm of breast: Secondary | ICD-10-CM

## 2013-03-25 DIAGNOSIS — N951 Menopausal and female climacteric states: Secondary | ICD-10-CM

## 2013-03-25 DIAGNOSIS — M8580 Other specified disorders of bone density and structure, unspecified site: Secondary | ICD-10-CM

## 2013-03-25 DIAGNOSIS — Z136 Encounter for screening for cardiovascular disorders: Secondary | ICD-10-CM

## 2013-03-25 DIAGNOSIS — E538 Deficiency of other specified B group vitamins: Secondary | ICD-10-CM

## 2013-03-25 LAB — URINALYSIS
Bilirubin Urine: NEGATIVE
Hgb urine dipstick: NEGATIVE
Ketones, ur: NEGATIVE
Leukocytes, UA: NEGATIVE
Nitrite: NEGATIVE
Total Protein, Urine: NEGATIVE
Urine Glucose: NEGATIVE
pH: 6 (ref 5.0–8.0)

## 2013-03-25 LAB — HEPATIC FUNCTION PANEL
ALT: 18 U/L (ref 0–35)
AST: 20 U/L (ref 0–37)
Alkaline Phosphatase: 53 U/L (ref 39–117)
Total Bilirubin: 0.9 mg/dL (ref 0.3–1.2)
Total Protein: 6.8 g/dL (ref 6.0–8.3)

## 2013-03-25 LAB — CBC WITH DIFFERENTIAL/PLATELET
Basophils Relative: 0.8 % (ref 0.0–3.0)
Eosinophils Absolute: 0.1 10*3/uL (ref 0.0–0.7)
Eosinophils Relative: 2.7 % (ref 0.0–5.0)
HCT: 34.3 % — ABNORMAL LOW (ref 36.0–46.0)
Hemoglobin: 11.7 g/dL — ABNORMAL LOW (ref 12.0–15.0)
Lymphocytes Relative: 26.1 % (ref 12.0–46.0)
Lymphs Abs: 1.2 10*3/uL (ref 0.7–4.0)
MCHC: 34.1 g/dL (ref 30.0–36.0)
MCV: 87.3 fl (ref 78.0–100.0)
Neutro Abs: 2.8 10*3/uL (ref 1.4–7.7)
Neutrophils Relative %: 58.2 % (ref 43.0–77.0)
RBC: 3.93 Mil/uL (ref 3.87–5.11)
WBC: 4.8 10*3/uL (ref 4.5–10.5)

## 2013-03-25 LAB — BASIC METABOLIC PANEL
CO2: 27 mEq/L (ref 19–32)
Calcium: 8.8 mg/dL (ref 8.4–10.5)
Chloride: 104 mEq/L (ref 96–112)
Sodium: 138 mEq/L (ref 135–145)

## 2013-03-25 LAB — TSH: TSH: 1.66 u[IU]/mL (ref 0.35–5.50)

## 2013-03-25 MED ORDER — FLUTICASONE PROPIONATE 50 MCG/ACT NA SUSP: 1.0000 | Freq: Every day | NASAL | Status: DC

## 2013-03-25 MED ORDER — ESTROGENS CONJUGATED 0.9 MG PO TABS: 0.9000 mg | ORAL_TABLET | Freq: Every day | ORAL | Status: DC

## 2013-03-25 NOTE — Assessment & Plan Note (Signed)
Continue with current prescription therapy as reflected on the Med list.  

## 2013-03-25 NOTE — Assessment & Plan Note (Signed)
Here for medicare wellness/physical  Diet: heart healthy  Physical activity: not sedentary  Depression/mood screen: negative  Hearing: intact to whispered voice  Visual acuity: grossly normal, performs annual eye exam  ADLs: capable  Fall risk: none  Home safety: good  Cognitive evaluation: intact to orientation, naming, recall and repetition  EOL planning: adv directives, full code/ I agree  I have personally reviewed and have noted  1. The patient's medical and social history  2. Their use of alcohol, tobacco or illicit drugs  3. Their current medications and supplements  4. The patient's functional ability including ADL's, fall risks, home safety risks and hearing or visual impairment.  5. Diet and physical activities  6. Evidence for depression or mood disorders    Today patient counseled on age appropriate routine health concerns for screening and prevention, each reviewed and up to date or declined. Immunizations reviewed and up to date or declined. Labs ordered and reviewed. Risk factors for depression reviewed and negative. Hearing function and visual acuity are intact. ADLs screened and addressed as needed. Functional ability and level of safety reviewed and appropriate. Education, counseling and referrals performed based on assessed risks today. Patient provided with a copy of personalized plan for preventive services.   The pt will call Dr Kinnie Scales re: sch colon Declined Flu shot Will give pneumovax

## 2013-03-25 NOTE — Progress Notes (Signed)
Pre visit review using our clinic review tool, if applicable. No additional management support is needed unless otherwise documented below in the visit note. 

## 2013-03-25 NOTE — Progress Notes (Signed)
   Subjective:    HPI  The patient is here for a wellness exam. The patient has been doing well overall without major physical or psychological issues going on lately. Can't sleep well w/low estrogen dose - hot flashes.   BP Readings from Last 3 Encounters:  03/25/13 140/80  02/29/12 122/80  03/07/10 112/70   Wt Readings from Last 3 Encounters:  03/25/13 137 lb (62.143 kg)  02/29/12 136 lb (61.689 kg)  03/07/10 136 lb (61.689 kg)       Review of Systems  Constitutional: Positive for diaphoresis. Negative for chills, activity change, appetite change, fatigue and unexpected weight change.  HENT: Positive for congestion. Negative for mouth sores and sinus pressure.   Eyes: Negative for visual disturbance.  Respiratory: Negative for cough and chest tightness.   Gastrointestinal: Negative for nausea and abdominal pain.  Genitourinary: Negative for frequency, difficulty urinating and vaginal pain.  Musculoskeletal: Positive for arthralgias. Negative for back pain and gait problem.  Skin: Negative for pallor and rash.  Neurological: Negative for dizziness, tremors, weakness, numbness and headaches.  Psychiatric/Behavioral: Positive for sleep disturbance. Negative for suicidal ideas and confusion. The patient is nervous/anxious.        Objective:   Physical Exam  Constitutional: She appears well-developed. No distress.  HENT:  Head: Normocephalic.  Right Ear: External ear normal.  Left Ear: External ear normal.  Nose: Nose normal.  Mouth/Throat: Oropharynx is clear and moist.  Eyes: Conjunctivae are normal. Pupils are equal, round, and reactive to light. Right eye exhibits no discharge. Left eye exhibits no discharge.  Neck: Normal range of motion. Neck supple. No JVD present. No tracheal deviation present. No thyromegaly present.  Cardiovascular: Normal rate, regular rhythm and normal heart sounds.   Pulmonary/Chest: No stridor. No respiratory distress. She has no wheezes.  She has no rales. She exhibits no tenderness.  B breasts w/fibrocystic changes L>R  Abdominal: Soft. Bowel sounds are normal. She exhibits no distension and no mass. There is no tenderness. There is no rebound and no guarding.  Musculoskeletal: She exhibits tenderness. She exhibits no edema.  Fingers w/OA deformities and L thumb base tenderness  Lymphadenopathy:    She has no cervical adenopathy.  Neurological: She displays normal reflexes. No cranial nerve deficit. She exhibits normal muscle tone. Coordination normal.  Skin: No rash noted. No erythema.  Psychiatric: She has a normal mood and affect. Her behavior is normal. Judgment and thought content normal.    Lab Results  Component Value Date   WBC 4.6 03/29/2012   HGB 12.4 03/29/2012   HCT 37.4 03/29/2012   PLT 183.0 03/29/2012   GLUCOSE 85 03/29/2012   CHOL 271* 03/29/2012   TRIG 242.0* 03/29/2012   HDL 58.60 03/29/2012   LDLDIRECT 161.5 03/29/2012   ALT 14 03/29/2012   AST 19 03/29/2012   NA 137 03/29/2012   K 4.7 03/29/2012   CL 107 03/29/2012   CREATININE 1.1 03/29/2012   BUN 17 03/29/2012   CO2 25 03/29/2012   TSH 1.31 03/29/2012         Assessment & Plan:

## 2013-03-27 ENCOUNTER — Other Ambulatory Visit: Payer: Self-pay | Admitting: *Deleted

## 2013-03-27 DIAGNOSIS — D649 Anemia, unspecified: Secondary | ICD-10-CM

## 2013-08-07 ENCOUNTER — Encounter: Payer: Self-pay | Admitting: Podiatry

## 2013-08-07 ENCOUNTER — Ambulatory Visit (INDEPENDENT_AMBULATORY_CARE_PROVIDER_SITE_OTHER): Payer: Medicare Other | Admitting: Podiatry

## 2013-08-07 ENCOUNTER — Ambulatory Visit (INDEPENDENT_AMBULATORY_CARE_PROVIDER_SITE_OTHER): Payer: Medicare Other

## 2013-08-07 VITALS — BP 100/55 | HR 75 | Resp 16 | Ht 63.5 in | Wt 129.0 lb

## 2013-08-07 DIAGNOSIS — M79673 Pain in unspecified foot: Secondary | ICD-10-CM

## 2013-08-07 DIAGNOSIS — M79609 Pain in unspecified limb: Secondary | ICD-10-CM

## 2013-08-07 DIAGNOSIS — M2010 Hallux valgus (acquired), unspecified foot: Secondary | ICD-10-CM

## 2013-08-07 DIAGNOSIS — M779 Enthesopathy, unspecified: Secondary | ICD-10-CM

## 2013-08-07 MED ORDER — TRIAMCINOLONE ACETONIDE 10 MG/ML IJ SUSP
10.0000 mg | Freq: Once | INTRAMUSCULAR | Status: AC
  Administered 2013-08-07: 10 mg

## 2013-08-07 NOTE — Progress Notes (Signed)
Subjective:     Patient ID: Amanda Cole, female   DOB: 05/05/1941, 73 y.o.   MRN: [id number]  Foot Pain   patient presents with achiness in the forefoot left that has been present for about 2 months. Has tried over-the-counter arch supports and shoe gear modification   Review of Systems  All other systems reviewed and are negative.       Objective:   Physical Exam  Nursing note and vitals reviewed. Constitutional: She is oriented to person, place, and time.  Cardiovascular: Intact distal pulses.   Musculoskeletal: Normal range of motion.  Neurological: She is oriented to person, place, and time.  Skin: Skin is warm.   neurovascular status intact with range of motion and muscle strength adequate. Patient's found to have intense discomfort third metatarsophalangeal joint left with inflammation and fluid around the joint surface and pain with pressure and does have flatfoot deformity with flexible type deformity noted     Assessment:     Capsulitis third MPJ left and flexible foot deformity with forefoot instability    Plan:     H&P and x-rays reviewed. The proximal nerve block around the area aspirated the joint able to get a small amount of clear fluid and injected with a quarter cc of dexamethasone Kenalog combination and applied thick plantar pad. Discussed orthotics when this condition is improved

## 2013-08-07 NOTE — Progress Notes (Signed)
   Subjective:    Patient ID: Amanda Cole, female    DOB: [birth year], 73 y.o.   MRN: [id number]  HPI Comments: "There is something under my foot that is sore"  Patient c/o aching plantar forefoot left for about 2 months. The area is swollen and feels hard when walking. She get pain through toes occasionally. She has tried OTC arch supports and helped some.   Foot Pain      Review of Systems  Allergic/Immunologic: Positive for environmental allergies.  All other systems reviewed and are negative.       Objective:   Physical Exam        Assessment & Plan:

## 2013-08-25 ENCOUNTER — Encounter: Payer: Self-pay | Admitting: Podiatry

## 2013-08-25 ENCOUNTER — Ambulatory Visit (INDEPENDENT_AMBULATORY_CARE_PROVIDER_SITE_OTHER): Payer: Medicare Other | Admitting: Podiatry

## 2013-08-25 VITALS — BP 114/62 | HR 69 | Resp 18

## 2013-08-25 DIAGNOSIS — M779 Enthesopathy, unspecified: Secondary | ICD-10-CM

## 2013-08-25 DIAGNOSIS — M216X9 Other acquired deformities of unspecified foot: Secondary | ICD-10-CM

## 2013-08-25 NOTE — Progress Notes (Signed)
° °  Subjective:    Patient ID: Amanda Cole, female    DOB: [birth year], 73 y.o.   MRN: [id number]  HPI It may be a little better on my left foot but it still is not well    Review of Systems     Objective:   Physical Exam        Assessment & Plan:

## 2013-08-26 NOTE — Progress Notes (Signed)
Subjective:     Patient ID: Amanda Cole, female   DOB: 08/19/1940, 73 y.o.   MRN: [id number]  HPI patient presents stating I am getting pain in my left foot still with mild improvement but difficulty with ambulation   Review of Systems     Objective:   Physical Exam Neurovascular status intact with diminished discomfort plantar left foot pain still present upon palpation    Assessment:     Continue tendinitis-like symptomatology with pain when pressed    Plan:     H&P reviewed with patient and discussed treatment options. Discussed continued physical therapy supportive shoe and scanned for custom orthotic devices. Reappoint when orthotics returned

## 2014-03-30 ENCOUNTER — Encounter: Payer: Medicare Other | Admitting: Internal Medicine

## 2014-05-22 ENCOUNTER — Ambulatory Visit (INDEPENDENT_AMBULATORY_CARE_PROVIDER_SITE_OTHER)
Admission: RE | Admit: 2014-05-22 | Discharge: 2014-05-22 | Disposition: A | Discharge status: Home / Self Care | Payer: Medicare Other | Source: Ambulatory Visit | Attending: Internal Medicine | Admitting: Internal Medicine

## 2014-05-22 ENCOUNTER — Encounter: Payer: Self-pay | Admitting: Internal Medicine

## 2014-05-22 ENCOUNTER — Ambulatory Visit (INDEPENDENT_AMBULATORY_CARE_PROVIDER_SITE_OTHER): Payer: Medicare Other | Admitting: Internal Medicine

## 2014-05-22 VITALS — BP 120/70 | HR 72 | Temp 97.9°F | Resp 12 | Ht 63.5 in | Wt 128.0 lb

## 2014-05-22 DIAGNOSIS — M89319 Hypertrophy of bone, unspecified shoulder: Secondary | ICD-10-CM

## 2014-05-22 DIAGNOSIS — M898X8 Other specified disorders of bone, other site: Secondary | ICD-10-CM

## 2014-05-22 DIAGNOSIS — M25811 Other specified joint disorders, right shoulder: Secondary | ICD-10-CM | POA: Diagnosis not present

## 2014-05-22 NOTE — Progress Notes (Signed)
Pre visit review using our clinic review tool, if applicable. No additional management support is needed unless otherwise documented below in the visit note. 

## 2014-05-22 NOTE — Assessment & Plan Note (Addendum)
R medial clavicle is enlarged (assymetric to L), NT Likely OA changes - X ray ordered. Pt reassured. RTC if worse

## 2014-05-22 NOTE — Progress Notes (Signed)
   Subjective:    HPI  C/o enlargement of the area of the R sterno-clavicular junction   BP Readings from Last 3 Encounters:  05/22/14 120/70  08/25/13 114/62  08/07/13 100/55   Wt Readings from Last 3 Encounters:  05/22/14 128 lb (58.06 kg)  08/07/13 129 lb (58.514 kg)  03/25/13 137 lb (62.143 kg)     Review of Systems  Constitutional: Positive for diaphoresis. Negative for chills, activity change, appetite change, fatigue and unexpected weight change.  HENT: Positive for congestion. Negative for mouth sores and sinus pressure.   Eyes: Negative for visual disturbance.  Respiratory: Negative for cough and chest tightness.   Gastrointestinal: Negative for nausea and abdominal pain.  Genitourinary: Negative for frequency, difficulty urinating and vaginal pain.  Musculoskeletal: Positive for arthralgias. Negative for back pain and gait problem.  Skin: Negative for pallor and rash.  Neurological: Negative for dizziness, tremors, weakness, numbness and headaches.  Psychiatric/Behavioral: Positive for sleep disturbance. Negative for suicidal ideas and confusion. The patient is nervous/anxious.        Objective:   Physical Exam  Constitutional: She appears well-developed. No distress.  HENT:  Head: Normocephalic.  Right Ear: External ear normal.  Left Ear: External ear normal.  Nose: Nose normal.  Mouth/Throat: Oropharynx is clear and moist.  Eyes: Conjunctivae are normal. Pupils are equal, round, and reactive to light. Right eye exhibits no discharge. Left eye exhibits no discharge.  Neck: Normal range of motion. Neck supple. No JVD present. No tracheal deviation present. No thyromegaly present.  Cardiovascular: Normal rate, regular rhythm and normal heart sounds.   Pulmonary/Chest: No stridor. No respiratory distress. She has no wheezes.  Abdominal: Soft. Bowel sounds are normal. She exhibits no distension and no mass. There is no tenderness. There is no rebound and no  guarding.  Musculoskeletal: She exhibits no edema or tenderness.  Lymphadenopathy:    She has no cervical adenopathy.  Neurological: She displays normal reflexes. No cranial nerve deficit. She exhibits normal muscle tone. Coordination normal.  Skin: No rash noted. No erythema.  Psychiatric: She has a normal mood and affect. Her behavior is normal. Judgment and thought content normal.  R medial clavicle is enlarged (assymetric to L), NT  Lab Results  Component Value Date   WBC 4.8 03/25/2013   HGB 11.7* 03/25/2013   HCT 34.3* 03/25/2013   PLT 190.0 03/25/2013   GLUCOSE 82 03/25/2013   CHOL 271* 03/29/2012   TRIG 242.0* 03/29/2012   HDL 58.60 03/29/2012   LDLDIRECT 161.5 03/29/2012   ALT 18 03/25/2013   AST 20 03/25/2013   NA 138 03/25/2013   K 3.9 03/25/2013   CL 104 03/25/2013   CREATININE 1.0 03/25/2013   BUN 19 03/25/2013   CO2 27 03/25/2013   TSH 1.66 03/25/2013         Assessment & Plan:  Patient ID: Amanda Cole, female   DOB: January 04, 1941, 74 y.o.   MRN: [id number]

## 2014-05-23 ENCOUNTER — Encounter: Payer: Self-pay | Admitting: Internal Medicine

## 2014-05-26 DIAGNOSIS — K635 Polyp of colon: Secondary | ICD-10-CM | POA: Diagnosis not present

## 2014-05-26 DIAGNOSIS — Z1211 Encounter for screening for malignant neoplasm of colon: Secondary | ICD-10-CM | POA: Diagnosis not present

## 2014-05-26 DIAGNOSIS — D124 Benign neoplasm of descending colon: Secondary | ICD-10-CM | POA: Diagnosis not present

## 2014-07-27 ENCOUNTER — Telehealth: Payer: Self-pay

## 2014-07-27 NOTE — Telephone Encounter (Signed)
Pt declined flu shot this season

## 2014-07-28 DIAGNOSIS — H3531 Nonexudative age-related macular degeneration: Secondary | ICD-10-CM | POA: Diagnosis not present

## 2014-07-28 DIAGNOSIS — H35372 Puckering of macula, left eye: Secondary | ICD-10-CM | POA: Diagnosis not present

## 2014-07-28 DIAGNOSIS — Z961 Presence of intraocular lens: Secondary | ICD-10-CM | POA: Diagnosis not present

## 2014-11-03 DIAGNOSIS — M19042 Primary osteoarthritis, left hand: Secondary | ICD-10-CM | POA: Diagnosis not present

## 2014-11-03 DIAGNOSIS — M7062 Trochanteric bursitis, left hip: Secondary | ICD-10-CM | POA: Diagnosis not present

## 2014-11-03 DIAGNOSIS — M19041 Primary osteoarthritis, right hand: Secondary | ICD-10-CM | POA: Diagnosis not present

## 2014-11-03 DIAGNOSIS — M25571 Pain in right ankle and joints of right foot: Secondary | ICD-10-CM | POA: Diagnosis not present

## 2014-11-03 DIAGNOSIS — M25572 Pain in left ankle and joints of left foot: Secondary | ICD-10-CM | POA: Diagnosis not present

## 2014-12-01 DIAGNOSIS — M19042 Primary osteoarthritis, left hand: Secondary | ICD-10-CM | POA: Diagnosis not present

## 2014-12-01 DIAGNOSIS — M7061 Trochanteric bursitis, right hip: Secondary | ICD-10-CM | POA: Diagnosis not present

## 2014-12-01 DIAGNOSIS — M19041 Primary osteoarthritis, right hand: Secondary | ICD-10-CM | POA: Diagnosis not present

## 2014-12-01 DIAGNOSIS — M7062 Trochanteric bursitis, left hip: Secondary | ICD-10-CM | POA: Diagnosis not present

## 2014-12-02 DIAGNOSIS — X32XXXD Exposure to sunlight, subsequent encounter: Secondary | ICD-10-CM | POA: Diagnosis not present

## 2014-12-02 DIAGNOSIS — L820 Inflamed seborrheic keratosis: Secondary | ICD-10-CM | POA: Diagnosis not present

## 2014-12-02 DIAGNOSIS — L570 Actinic keratosis: Secondary | ICD-10-CM | POA: Diagnosis not present

## 2015-04-28 DIAGNOSIS — L309 Dermatitis, unspecified: Secondary | ICD-10-CM | POA: Diagnosis not present

## 2015-06-22 ENCOUNTER — Other Ambulatory Visit (INDEPENDENT_AMBULATORY_CARE_PROVIDER_SITE_OTHER): Payer: Medicare Other

## 2015-06-22 ENCOUNTER — Ambulatory Visit (INDEPENDENT_AMBULATORY_CARE_PROVIDER_SITE_OTHER): Payer: Medicare Other | Admitting: Internal Medicine

## 2015-06-22 ENCOUNTER — Encounter: Payer: Self-pay | Admitting: Internal Medicine

## 2015-06-22 VITALS — BP 120/72 | HR 71 | Wt 128.0 lb

## 2015-06-22 DIAGNOSIS — M1990 Unspecified osteoarthritis, unspecified site: Secondary | ICD-10-CM | POA: Diagnosis not present

## 2015-06-22 DIAGNOSIS — M8580 Other specified disorders of bone density and structure, unspecified site: Secondary | ICD-10-CM | POA: Diagnosis not present

## 2015-06-22 DIAGNOSIS — E559 Vitamin D deficiency, unspecified: Secondary | ICD-10-CM

## 2015-06-22 DIAGNOSIS — R21 Rash and other nonspecific skin eruption: Secondary | ICD-10-CM | POA: Diagnosis not present

## 2015-06-22 DIAGNOSIS — L219 Seborrheic dermatitis, unspecified: Secondary | ICD-10-CM

## 2015-06-22 DIAGNOSIS — R202 Paresthesia of skin: Secondary | ICD-10-CM | POA: Diagnosis not present

## 2015-06-22 DIAGNOSIS — E538 Deficiency of other specified B group vitamins: Secondary | ICD-10-CM

## 2015-06-22 LAB — BASIC METABOLIC PANEL
BUN: 37 mg/dL — ABNORMAL HIGH (ref 6–23)
CALCIUM: 9.5 mg/dL (ref 8.4–10.5)
CHLORIDE: 108 meq/L (ref 96–112)
CO2: 26 meq/L (ref 19–32)
CREATININE: 1.11 mg/dL (ref 0.40–1.20)
GFR: 50.71 mL/min — ABNORMAL LOW (ref >60.00)
GLUCOSE: 84 mg/dL (ref 70–99)
Potassium: 4.7 mEq/L (ref 3.5–5.1)
SODIUM: 141 meq/L (ref 135–145)

## 2015-06-22 LAB — URINALYSIS
BILIRUBIN URINE: NEGATIVE
KETONES UR: NEGATIVE
LEUKOCYTES UA: NEGATIVE
Nitrite: NEGATIVE
SPECIFIC GRAVITY, URINE: 1.025 (ref 1.000–1.030)
Total Protein, Urine: NEGATIVE
URINE GLUCOSE: NEGATIVE
UROBILINOGEN UA: 0.2 (ref 0.0–1.0)
pH: 5.5 (ref 5.0–8.0)

## 2015-06-22 LAB — HEPATIC FUNCTION PANEL
ALBUMIN: 4 g/dL (ref 3.5–5.2)
ALK PHOS: 72 U/L (ref 39–117)
ALT: 17 U/L (ref 0–35)
AST: 18 U/L (ref 0–37)
BILIRUBIN DIRECT: 0.1 mg/dL (ref 0.0–0.3)
TOTAL PROTEIN: 6.8 g/dL (ref 6.0–8.3)
Total Bilirubin: 0.4 mg/dL (ref 0.2–1.2)

## 2015-06-22 LAB — RHEUMATOID FACTOR

## 2015-06-22 LAB — CBC WITH DIFFERENTIAL/PLATELET
BASOS ABS: 0 10*3/uL (ref 0.0–0.1)
BASOS PCT: 0.9 % (ref 0.0–3.0)
Eosinophils Absolute: 0.1 10*3/uL (ref 0.0–0.7)
Eosinophils Relative: 2.9 % (ref 0.0–5.0)
HEMATOCRIT: 37.4 % (ref 36.0–46.0)
Hemoglobin: 12.4 g/dL (ref 12.0–15.0)
LYMPHS ABS: 1.1 10*3/uL (ref 0.7–4.0)
LYMPHS PCT: 29.3 % (ref 12.0–46.0)
MCHC: 33.3 g/dL (ref 30.0–36.0)
MCV: 87.9 fl (ref 78.0–100.0)
MONOS PCT: 13.5 % — AB (ref 3.0–12.0)
Monocytes Absolute: 0.5 10*3/uL (ref 0.1–1.0)
NEUTROS ABS: 2.1 10*3/uL (ref 1.4–7.7)
NEUTROS PCT: 53.4 % (ref 43.0–77.0)
PLATELETS: 209 10*3/uL (ref 150.0–400.0)
RBC: 4.26 Mil/uL (ref 3.87–5.11)
RDW: 14.2 % (ref 11.5–15.5)
WBC: 3.9 10*3/uL — ABNORMAL LOW (ref 4.0–10.5)

## 2015-06-22 LAB — SEDIMENTATION RATE: Sed Rate: 23 mm/hr — ABNORMAL HIGH (ref 0–22)

## 2015-06-22 LAB — TSH: TSH: 1.05 u[IU]/mL (ref 0.35–4.50)

## 2015-06-22 LAB — VITAMIN B12: Vitamin B-12: 207 pg/mL — ABNORMAL LOW (ref 211–911)

## 2015-06-22 MED ORDER — CLOTRIMAZOLE-BETAMETHASONE 1-0.05 % EX CREA: 1.0000 "application " | TOPICAL_CREAM | Freq: Two times a day (BID) | CUTANEOUS | Status: DC

## 2015-06-22 MED ORDER — SULFAMETHOXAZOLE-TRIMETHOPRIM 400-80 MG PO TABS: 1.0000 | ORAL_TABLET | Freq: Two times a day (BID) | ORAL | Status: DC

## 2015-06-22 NOTE — Patient Instructions (Signed)
Stop using a makeup

## 2015-06-22 NOTE — Assessment & Plan Note (Addendum)
2/17 face - perioral dermatitis ?acne vs other  PO abx

## 2015-06-22 NOTE — Progress Notes (Signed)
Pre visit review using our clinic review tool, if applicable. No additional management support is needed unless otherwise documented below in the visit note. 

## 2015-06-22 NOTE — Assessment & Plan Note (Signed)
Dr Charlestine Night ?inflamatory OA, finger deforming - B hands Worse - sch labs PO abx

## 2015-06-22 NOTE — Progress Notes (Signed)
Subjective:  Patient ID: Amanda Cole, female    DOB: Oct 19, 1940  Age: 75 y.o. MRN: [id number]  CC: No chief complaint on file.   HPI MEYLANI KOMARA presents for rash around eyelids and pimples on face - months  Outpatient Prescriptions Prior to Visit  Medication Sig Dispense Refill  . fluticasone (FLONASE) 50 MCG/ACT nasal spray Place 1 spray into both nostrils daily. 48 g 3  . estrogens, conjugated, (PREMARIN) 0.9 MG tablet Take 1 tablet (0.9 mg total) by mouth daily. (Patient not taking: Reported on 06/22/2015) 90 tablet 3  . naproxen sodium (ALEVE) 220 MG tablet Take 1 tablet (220 mg total) by mouth 2 (two) times daily with a meal. (Patient not taking: Reported on 06/22/2015) 100 tablet 3   No facility-administered medications prior to visit.    ROS Review of Systems  Constitutional: Negative for chills, activity change, appetite change, fatigue and unexpected weight change.  HENT: Negative for congestion, mouth sores and sinus pressure.   Eyes: Negative for visual disturbance.  Respiratory: Negative for cough and chest tightness.   Gastrointestinal: Negative for nausea and abdominal pain.  Genitourinary: Negative for frequency, difficulty urinating and vaginal pain.  Musculoskeletal: Positive for joint swelling and arthralgias. Negative for back pain, gait problem and neck stiffness.  Skin: Positive for rash. Negative for pallor.  Neurological: Negative for dizziness, tremors, weakness, numbness and headaches.  Psychiatric/Behavioral: Negative for confusion and sleep disturbance.    Objective:  BP 120/72 mmHg  Pulse 71  Wt 128 lb (58.06 kg)  SpO2 98%  BP Readings from Last 3 Encounters:  06/22/15 120/72  05/22/14 120/70  08/25/13 114/62    Wt Readings from Last 3 Encounters:  06/22/15 128 lb (58.06 kg)  05/22/14 128 lb (58.06 kg)  08/07/13 129 lb (58.514 kg)    Physical Exam  Constitutional: She appears well-developed. No distress.  HENT:  Head:  Normocephalic.  Right Ear: External ear normal.  Left Ear: External ear normal.  Nose: Nose normal.  Mouth/Throat: Oropharynx is clear and moist.  Eyes: Conjunctivae are normal. Pupils are equal, round, and reactive to light. Right eye exhibits no discharge. Left eye exhibits no discharge.  Neck: Normal range of motion. Neck supple. No JVD present. No tracheal deviation present. No thyromegaly present.  Cardiovascular: Normal rate, regular rhythm and normal heart sounds.   Pulmonary/Chest: No stridor. No respiratory distress. She has no wheezes.  Abdominal: Soft. Bowel sounds are normal. She exhibits no distension and no mass. There is no tenderness. There is no rebound and no guarding.  Musculoskeletal: She exhibits no edema or tenderness.  Lymphadenopathy:    She has no cervical adenopathy.  Neurological: She displays normal reflexes. No cranial nerve deficit. She exhibits normal muscle tone. Coordination normal.  Skin: Rash noted. No erythema.  Psychiatric: She has a normal mood and affect. Her behavior is normal. Judgment and thought content normal.  scattered small acne on cheeks, nasolabial fold Scaly eyelids  Lab Results  Component Value Date   WBC 4.8 03/25/2013   HGB 11.7* 03/25/2013   HCT 34.3* 03/25/2013   PLT 190.0 03/25/2013   GLUCOSE 82 03/25/2013   CHOL 271* 03/29/2012   TRIG 242.0* 03/29/2012   HDL 58.60 03/29/2012   LDLDIRECT 161.5 03/29/2012   ALT 18 03/25/2013   AST 20 03/25/2013   NA 138 03/25/2013   K 3.9 03/25/2013   CL 104 03/25/2013   CREATININE 1.0 03/25/2013   BUN 19 03/25/2013   CO2 27 03/25/2013  TSH 1.66 03/25/2013    Dg Clavicle Right  05/22/2014  CLINICAL DATA:  Enlargement along the medial right clavicle x2 weeks, no known injury EXAM: RIGHT CLAVICLE - 2+ VIEWS COMPARISON:  None. FINDINGS: No fracture or dislocation is seen. The joint spaces are preserved. The visualized soft tissues are unremarkable. Visualized right lung is clear.  IMPRESSION: No acute osseous abnormality is seen. Electronically Signed   By: Julian Hy M.D.   On: 05/22/2014 14:54    Assessment & Plan:   Diagnoses and all orders for this visit:  Seborrheic dermatitis  RASH-NONVESICULAR   I have discontinued Ms. Nadal's naproxen sodium and estrogens (conjugated). I am also having her maintain her fluticasone, fluticasone, and ibuprofen.  Meds ordered this encounter  Medications  . fluticasone (CUTIVATE) 0.05 % cream    Sig: as needed. Reported on 06/22/2015    Refill:  3  . ibuprofen (ADVIL,MOTRIN) 200 MG tablet    Sig: Take 400 mg by mouth 2 (two) times daily.     Follow-up: No Follow-up on file.  Walker Kehr, MD

## 2015-06-22 NOTE — Assessment & Plan Note (Signed)
Lotrisone cream Doxy F/u w/Dermatology

## 2015-06-23 ENCOUNTER — Other Ambulatory Visit: Payer: Self-pay | Admitting: Internal Medicine

## 2015-06-23 LAB — VITAMIN D 25 HYDROXY (VIT D DEFICIENCY, FRACTURES)
VIT D 25 HYDROXY: 29 ng/mL — AB (ref 30–100)
VITD: 51.33 ng/mL (ref 30.00–100.00)

## 2015-06-23 MED ORDER — ERGOCALCIFEROL 1.25 MG (50000 UT) PO CAPS: 50000.0000 [IU] | ORAL_CAPSULE | ORAL | Status: DC

## 2015-06-23 MED ORDER — VITAMIN D 1000 UNITS PO TABS: 1000.0000 [IU] | ORAL_TABLET | Freq: Every day | ORAL | Status: AC

## 2015-06-24 ENCOUNTER — Telehealth: Payer: Self-pay | Admitting: Internal Medicine

## 2015-06-24 MED ORDER — VITAMIN B-12 1000 MCG SL SUBL: 1.0000 | SUBLINGUAL_TABLET | Freq: Every day | SUBLINGUAL | Status: DC

## 2015-06-24 NOTE — Assessment & Plan Note (Signed)
Worse Re-start Vit B12

## 2015-06-24 NOTE — Telephone Encounter (Signed)
Please call back in regards to labs

## 2015-08-03 DIAGNOSIS — H353131 Nonexudative age-related macular degeneration, bilateral, early dry stage: Secondary | ICD-10-CM | POA: Diagnosis not present

## 2015-08-03 DIAGNOSIS — Z961 Presence of intraocular lens: Secondary | ICD-10-CM | POA: Diagnosis not present

## 2015-08-03 DIAGNOSIS — H26491 Other secondary cataract, right eye: Secondary | ICD-10-CM | POA: Diagnosis not present

## 2015-08-03 DIAGNOSIS — H35372 Puckering of macula, left eye: Secondary | ICD-10-CM | POA: Diagnosis not present

## 2015-08-09 ENCOUNTER — Ambulatory Visit (INDEPENDENT_AMBULATORY_CARE_PROVIDER_SITE_OTHER): Payer: Medicare Other | Admitting: Internal Medicine

## 2015-08-09 ENCOUNTER — Encounter: Payer: Self-pay | Admitting: Internal Medicine

## 2015-08-09 VITALS — BP 118/62 | HR 68 | Wt 127.0 lb

## 2015-08-09 DIAGNOSIS — R21 Rash and other nonspecific skin eruption: Secondary | ICD-10-CM

## 2015-08-09 DIAGNOSIS — E538 Deficiency of other specified B group vitamins: Secondary | ICD-10-CM

## 2015-08-09 DIAGNOSIS — L219 Seborrheic dermatitis, unspecified: Secondary | ICD-10-CM | POA: Diagnosis not present

## 2015-08-09 MED ORDER — CLOTRIMAZOLE-BETAMETHASONE 1-0.05 % EX CREA: 1.0000 "application " | TOPICAL_CREAM | Freq: Two times a day (BID) | CUTANEOUS | Status: DC

## 2015-08-09 NOTE — Assessment & Plan Note (Signed)
recurrent - months Use Lotrisone

## 2015-08-09 NOTE — Assessment & Plan Note (Signed)
On SL B12 

## 2015-08-09 NOTE — Progress Notes (Signed)
Pre visit review using our clinic review tool, if applicable. No additional management support is needed unless otherwise documented below in the visit note. 

## 2015-08-09 NOTE — Progress Notes (Signed)
Subjective:  Patient ID: Amanda Cole, female    DOB: 1941-02-07  Age: 75 y.o. MRN: [id number]  CC: No chief complaint on file.   HPI Amanda Cole presents for rash, B12 def, vit d def  Outpatient Prescriptions Prior to Visit  Medication Sig Dispense Refill  . cholecalciferol (VITAMIN D) 1000 units tablet Take 1 tablet (1,000 Units total) by mouth daily. 100 tablet 3  . clotrimazole-betamethasone (LOTRISONE) cream Apply 1 application topically 2 (two) times daily. 15 g 1  . Cyanocobalamin (VITAMIN B-12) 1000 MCG SUBL Place 1 tablet (1,000 mcg total) under the tongue daily. 100 tablet 3  . ergocalciferol (VITAMIN D2) 50000 units capsule Take 1 capsule (50,000 Units total) by mouth once a week. 6 capsule 0  . fluticasone (CUTIVATE) 0.05 % cream as needed. Reported on 06/22/2015  3  . fluticasone (FLONASE) 50 MCG/ACT nasal spray Place 1 spray into both nostrils daily. 48 g 3  . ibuprofen (ADVIL,MOTRIN) 200 MG tablet Take 400 mg by mouth 2 (two) times daily.    Marland Kitchen sulfamethoxazole-trimethoprim (SEPTRA) 400-80 MG tablet Take 1 tablet by mouth 2 (two) times daily. (Patient not taking: Reported on 08/09/2015) 28 tablet 0   No facility-administered medications prior to visit.    ROS Review of Systems  Constitutional: Negative for chills, activity change, appetite change, fatigue and unexpected weight change.  HENT: Negative for congestion, mouth sores and sinus pressure.   Eyes: Negative for visual disturbance.  Respiratory: Negative for cough and chest tightness.   Gastrointestinal: Negative for nausea and abdominal pain.  Genitourinary: Negative for frequency, difficulty urinating and vaginal pain.  Musculoskeletal: Negative for back pain and gait problem.  Skin: Negative for pallor and rash.  Neurological: Negative for dizziness, tremors, weakness, numbness and headaches.  Psychiatric/Behavioral: Negative for suicidal ideas, confusion and sleep disturbance. The patient is not  nervous/anxious.     Objective:  BP 118/62 mmHg  Pulse 68  Wt 127 lb (57.607 kg)  SpO2 96%  BP Readings from Last 3 Encounters:  08/09/15 118/62  06/22/15 120/72  05/22/14 120/70    Wt Readings from Last 3 Encounters:  08/09/15 127 lb (57.607 kg)  06/22/15 128 lb (58.06 kg)  05/22/14 128 lb (58.06 kg)    Physical Exam  Constitutional: She appears well-developed. No distress.  HENT:  Head: Normocephalic.  Right Ear: External ear normal.  Left Ear: External ear normal.  Nose: Nose normal.  Mouth/Throat: Oropharynx is clear and moist.  Eyes: Conjunctivae are normal. Pupils are equal, round, and reactive to light. Right eye exhibits no discharge. Left eye exhibits no discharge.  Neck: Normal range of motion. Neck supple. No JVD present. No tracheal deviation present. No thyromegaly present.  Cardiovascular: Normal rate, regular rhythm and normal heart sounds.   Pulmonary/Chest: No stridor. No respiratory distress. She has no wheezes.  Abdominal: Soft. Bowel sounds are normal. She exhibits no distension and no mass. There is no tenderness. There is no rebound and no guarding.  Musculoskeletal: She exhibits no edema or tenderness.  Lymphadenopathy:    She has no cervical adenopathy.  Neurological: She displays normal reflexes. No cranial nerve deficit. She exhibits normal muscle tone. Coordination normal.  Skin: No rash noted. No erythema.  Psychiatric: She has a normal mood and affect. Her behavior is normal. Judgment and thought content normal.    Lab Results  Component Value Date   WBC 3.9* 06/22/2015   HGB 12.4 06/22/2015   HCT 37.4 06/22/2015   PLT  209.0 06/22/2015   GLUCOSE 84 06/22/2015   CHOL 271* 03/29/2012   TRIG 242.0* 03/29/2012   HDL 58.60 03/29/2012   LDLDIRECT 161.5 03/29/2012   ALT 17 06/22/2015   AST 18 06/22/2015   NA 141 06/22/2015   K 4.7 06/22/2015   CL 108 06/22/2015   CREATININE 1.11 06/22/2015   BUN 37* 06/22/2015   CO2 26 06/22/2015    TSH 1.05 06/22/2015    Dg Clavicle Right  05/22/2014  CLINICAL DATA:  Enlargement along the medial right clavicle x2 weeks, no known injury EXAM: RIGHT CLAVICLE - 2+ VIEWS COMPARISON:  None. FINDINGS: No fracture or dislocation is seen. The joint spaces are preserved. The visualized soft tissues are unremarkable. Visualized right lung is clear. IMPRESSION: No acute osseous abnormality is seen. Electronically Signed   By: Julian Hy M.D.   On: 05/22/2014 14:54    Assessment & Plan:   Diagnoses and all orders for this visit:  Seborrheic dermatitis  RASH-NONVESICULAR  Vitamin B12 deficiency   I have discontinued Ms. Richman's sulfamethoxazole-trimethoprim. I am also having her maintain her fluticasone, fluticasone, ibuprofen, clotrimazole-betamethasone, cholecalciferol, ergocalciferol, and Vitamin B-12.  No orders of the defined types were placed in this encounter.     Follow-up: No Follow-up on file.  Walker Kehr, MD

## 2015-08-09 NOTE — Assessment & Plan Note (Signed)
2/17 face - perioral dermatitis

## 2015-11-10 DIAGNOSIS — X32XXXD Exposure to sunlight, subsequent encounter: Secondary | ICD-10-CM | POA: Diagnosis not present

## 2015-11-10 DIAGNOSIS — L570 Actinic keratosis: Secondary | ICD-10-CM | POA: Diagnosis not present

## 2015-11-10 DIAGNOSIS — Z1283 Encounter for screening for malignant neoplasm of skin: Secondary | ICD-10-CM | POA: Diagnosis not present

## 2015-12-09 ENCOUNTER — Ambulatory Visit: Payer: Medicare Other | Admitting: Internal Medicine

## 2016-05-16 ENCOUNTER — Other Ambulatory Visit: Payer: Medicare Other

## 2016-05-16 ENCOUNTER — Telehealth: Payer: Self-pay

## 2016-05-16 ENCOUNTER — Ambulatory Visit (INDEPENDENT_AMBULATORY_CARE_PROVIDER_SITE_OTHER): Payer: Medicare Other | Admitting: Internal Medicine

## 2016-05-16 DIAGNOSIS — R300 Dysuria: Secondary | ICD-10-CM

## 2016-05-16 MED ORDER — CEPHALEXIN 500 MG PO CAPS: 500.0000 mg | ORAL_CAPSULE | Freq: Three times a day (TID) | ORAL | 0 refills | Status: AC

## 2016-05-16 NOTE — Progress Notes (Signed)
Pre visit review using our clinic review tool, if applicable. No additional management support is needed unless otherwise documented below in the visit note. 

## 2016-05-16 NOTE — Patient Instructions (Signed)
Please take all new medication as prescribed - the antibiotic  Your specimen was sent for culture today  Please continue all other medications as before  Please have the pharmacy call with any other refills you may need.  Please keep your appointments with your specialists as you may have planned

## 2016-05-16 NOTE — Progress Notes (Signed)
   Subjective:    Patient ID: Amanda Cole, female    DOB: 1940-07-12, 76 y.o.   MRN: [id number]  HPI  Here to f/u with acute visit; c/o urinary symptoms of dysuria, frequency, but no urgency, flank pain, hematuria or n/v, fever, chills. Seems just like previous infections, with last > 6 mo.   Pt denies chest pain, increased sob or doe, wheezing, orthopnea, PND, increased LE swelling, palpitations, dizziness or syncope.  Pt denies new neurological symptoms such as new headache, or facial or extremity weakness or numbness   Past Medical History:  Diagnosis Date  . Cataract    Past Surgical History:  Procedure Laterality Date  . CATARACT EXTRACTION     B    reports that she has never smoked. She does not have any smokeless tobacco history on file. She reports that she does not drink alcohol or use drugs. family history includes Arthritis in her mother. Allergies  Allergen Reactions  . Nitrofurantoin     REACTION: rash  . Tetracycline     REACTION: rash   Current Outpatient Prescriptions on File Prior to Visit  Medication Sig Dispense Refill  . clotrimazole-betamethasone (LOTRISONE) cream Apply 1 application topically 2 (two) times daily. 45 g 1  . fluticasone (CUTIVATE) 0.05 % cream as needed. Reported on 06/22/2015  3  . fluticasone (FLONASE) 50 MCG/ACT nasal spray Place 1 spray into both nostrils daily. 48 g 3  . ibuprofen (ADVIL,MOTRIN) 200 MG tablet Take 400 mg by mouth 2 (two) times daily.    . cholecalciferol (VITAMIN D) 1000 units tablet Take 1 tablet (1,000 Units total) by mouth daily. (Patient not taking: Reported on 05/16/2016) 100 tablet 3  . Cyanocobalamin (VITAMIN B-12) 1000 MCG SUBL Place 1 tablet (1,000 mcg total) under the tongue daily. (Patient not taking: Reported on 05/16/2016) 100 tablet 3  . ergocalciferol (VITAMIN D2) 50000 units capsule Take 1 capsule (50,000 Units total) by mouth once a week. (Patient not taking: Reported on 05/16/2016) 6 capsule 0   No current  facility-administered medications on file prior to visit.    Review of Systems All otherwise neg per pt     Objective:   Physical Exam BP 118/70   Pulse 76   Resp 20   Wt 127 lb (57.6 kg)   SpO2 98%   BMI 22.14 kg/m  VS noted,  Constitutional: Pt appears in no apparent distress HENT: Head: NCAT.  Right Ear: External ear normal.  Left Ear: External ear normal.  Eyes: . Pupils are equal, round, and reactive to light. Conjunctivae and EOM are normal Neck: Normal range of motion. Neck supple.  Cardiovascular: Normal rate and regular rhythm.   Pulmonary/Chest: Effort normal and breath sounds without rales or wheezing.  Abd:  Soft,  ND, + BS with low mid abd tender, no guarding or rebound, no flank tender Neurological: Pt is alert. Not confused , motor grossly intact Skin: Skin is warm. No rash, no LE edema Psychiatric: Pt behavior is normal. No agitation.  No other exam findings    Assessment & Plan:

## 2016-05-16 NOTE — Assessment & Plan Note (Signed)
Mild to mod, c/w prob uti, for antibx course, f/u urine cx, to f/u any worsening symptoms or concerns

## 2016-05-16 NOTE — Telephone Encounter (Signed)
Orders placed.

## 2016-05-18 LAB — CULTURE, URINE COMPREHENSIVE: ORGANISM ID, BACTERIA: NO GROWTH

## 2016-06-21 ENCOUNTER — Telehealth: Payer: Self-pay | Admitting: Internal Medicine

## 2016-06-21 NOTE — Telephone Encounter (Signed)
Called patient to schedule awv. Left msg for patient to call office and schedule appt.  °

## 2016-08-07 DIAGNOSIS — H35372 Puckering of macula, left eye: Secondary | ICD-10-CM | POA: Diagnosis not present

## 2016-08-07 DIAGNOSIS — H26491 Other secondary cataract, right eye: Secondary | ICD-10-CM | POA: Diagnosis not present

## 2016-08-07 DIAGNOSIS — Z961 Presence of intraocular lens: Secondary | ICD-10-CM | POA: Diagnosis not present

## 2016-08-07 DIAGNOSIS — H353131 Nonexudative age-related macular degeneration, bilateral, early dry stage: Secondary | ICD-10-CM | POA: Diagnosis not present

## 2016-08-15 ENCOUNTER — Ambulatory Visit (INDEPENDENT_AMBULATORY_CARE_PROVIDER_SITE_OTHER): Payer: Medicare Other | Admitting: Internal Medicine

## 2016-08-15 ENCOUNTER — Encounter: Payer: Self-pay | Admitting: Internal Medicine

## 2016-08-15 DIAGNOSIS — R21 Rash and other nonspecific skin eruption: Secondary | ICD-10-CM | POA: Diagnosis not present

## 2016-08-15 MED ORDER — METRONIDAZOLE 0.75 % EX LOTN: TOPICAL_LOTION | CUTANEOUS | 3 refills | Status: DC

## 2016-08-15 NOTE — Patient Instructions (Signed)
[facility]

## 2016-08-15 NOTE — Assessment & Plan Note (Addendum)
Derm ref - may need bx Trial of Metrogel  Dr Juel Burrow office

## 2016-08-15 NOTE — Progress Notes (Signed)
Subjective:  Patient ID: Amanda Cole, female    DOB: [birth year]  Age: 76 y.o. MRN: [id number]  CC: Rash (Pt stated have rash on the face are itching, redness for about 1 year)   HPI [name] presents for recurrent rash x 1 year - worse  Outpatient Medications Prior to Visit  Medication Sig Dispense Refill  . clotrimazole-betamethasone (LOTRISONE) cream Apply 1 application topically 2 (two) times daily. 45 g 1  . Cyanocobalamin (VITAMIN B-12) 1000 MCG SUBL Place 1 tablet (1,000 mcg total) under the tongue daily. (Patient not taking: Reported on 05/16/2016) 100 tablet 3  . ergocalciferol (VITAMIN D2) 50000 units capsule Take 1 capsule (50,000 Units total) by mouth once a week. (Patient not taking: Reported on 05/16/2016) 6 capsule 0  . fluticasone (CUTIVATE) 0.05 % cream as needed. Reported on 06/22/2015  3  . fluticasone (FLONASE) 50 MCG/ACT nasal spray Place 1 spray into both nostrils daily. 48 g 3  . ibuprofen (ADVIL,MOTRIN) 200 MG tablet Take 400 mg by mouth 2 (two) times daily.     No facility-administered medications prior to visit.     ROS Review of Systems  Constitutional: Negative for activity change, appetite change, chills, fatigue and unexpected weight change.  HENT: Negative for congestion, mouth sores and sinus pressure.   Eyes: Negative for visual disturbance.  Respiratory: Negative for cough and chest tightness.   Gastrointestinal: Negative for abdominal pain and nausea.  Genitourinary: Negative for difficulty urinating, frequency and vaginal pain.  Musculoskeletal: Negative for back pain and gait problem.  Skin: Positive for rash. Negative for pallor.  Neurological: Negative for dizziness, tremors, weakness, numbness and headaches.  Psychiatric/Behavioral: Negative for confusion and sleep disturbance.    Objective:  BP 116/68   Pulse 65   Temp 97.7 F (36.5 C)   Ht 5' 3.5" (1.613 m)   Wt 125 lb (56.7 kg)   SpO2 98%   BMI 21.80 kg/m   BP Readings  from Last 3 Encounters:  08/15/16 116/68  05/16/16 118/70  08/09/15 118/62    Wt Readings from Last 3 Encounters:  08/15/16 125 lb (56.7 kg)  05/16/16 127 lb (57.6 kg)  08/09/15 127 lb (57.6 kg)    Physical Exam  Constitutional: She appears well-developed. No distress.  HENT:  Head: Normocephalic.  Right Ear: External ear normal.  Left Ear: External ear normal.  Nose: Nose normal.  Mouth/Throat: Oropharynx is clear and moist.  Eyes: Conjunctivae are normal. Pupils are equal, round, and reactive to light. Right eye exhibits no discharge. Left eye exhibits no discharge.  Neck: Normal range of motion. Neck supple. No JVD present. No tracheal deviation present. No thyromegaly present.  Cardiovascular: Normal rate, regular rhythm and normal heart sounds.   Pulmonary/Chest: No stridor. No respiratory distress. She has no wheezes.  Abdominal: Soft. Bowel sounds are normal. She exhibits no distension and no mass. There is no tenderness. There is no rebound and no guarding.  Musculoskeletal: She exhibits no edema or tenderness.  Lymphadenopathy:    She has no cervical adenopathy.  Neurological: She displays normal reflexes. No cranial nerve deficit. She exhibits normal muscle tone. Coordination normal.  Skin: Rash noted. No erythema.  Psychiatric: She has a normal mood and affect. Her behavior is normal. Judgment and thought content normal.  eryth papules on the face  Lab Results  Component Value Date   WBC 3.9 (L) 06/22/2015   HGB 12.4 06/22/2015   HCT 37.4 06/22/2015   PLT 209.0 06/22/2015  GLUCOSE 84 06/22/2015   CHOL 271 (H) 03/29/2012   TRIG 242.0 (H) 03/29/2012   HDL 58.60 03/29/2012   LDLDIRECT 161.5 03/29/2012   ALT 17 06/22/2015   AST 18 06/22/2015   NA 141 06/22/2015   K 4.7 06/22/2015   CL 108 06/22/2015   CREATININE 1.11 06/22/2015   BUN 37 (H) 06/22/2015   CO2 26 06/22/2015   TSH 1.05 06/22/2015    Dg Clavicle Right  Result Date: 05/22/2014 CLINICAL DATA:   Enlargement along the medial right clavicle x2 weeks, no known injury EXAM: RIGHT CLAVICLE - 2+ VIEWS COMPARISON:  None. FINDINGS: No fracture or dislocation is seen. The joint spaces are preserved. The visualized soft tissues are unremarkable. Visualized right lung is clear. IMPRESSION: No acute osseous abnormality is seen. Electronically Signed   By: Julian Hy M.D.   On: 05/22/2014 14:54    Assessment & Plan:   There are no diagnoses linked to this encounter. I am having Ms. Rumpf maintain her fluticasone, fluticasone, ibuprofen, ergocalciferol, Vitamin B-12, and clotrimazole-betamethasone.  No orders of the defined types were placed in this encounter.    Follow-up: No Follow-up on file.  Walker Kehr, MD

## 2016-08-23 DIAGNOSIS — L718 Other rosacea: Secondary | ICD-10-CM | POA: Diagnosis not present

## 2016-09-11 DIAGNOSIS — L820 Inflamed seborrheic keratosis: Secondary | ICD-10-CM | POA: Diagnosis not present

## 2016-09-11 DIAGNOSIS — L718 Other rosacea: Secondary | ICD-10-CM | POA: Diagnosis not present

## 2016-10-24 ENCOUNTER — Telehealth: Payer: Self-pay | Admitting: Internal Medicine

## 2016-10-24 NOTE — Telephone Encounter (Signed)
Called patient regarding awv. Lvm for patient to call office to schedule appt.

## 2017-04-02 DIAGNOSIS — L718 Other rosacea: Secondary | ICD-10-CM | POA: Diagnosis not present

## 2017-08-07 DIAGNOSIS — Z961 Presence of intraocular lens: Secondary | ICD-10-CM | POA: Diagnosis not present

## 2017-08-07 DIAGNOSIS — H35372 Puckering of macula, left eye: Secondary | ICD-10-CM | POA: Diagnosis not present

## 2017-08-07 DIAGNOSIS — H353131 Nonexudative age-related macular degeneration, bilateral, early dry stage: Secondary | ICD-10-CM | POA: Diagnosis not present

## 2017-08-07 DIAGNOSIS — H26491 Other secondary cataract, right eye: Secondary | ICD-10-CM | POA: Diagnosis not present

## 2018-04-05 ENCOUNTER — Ambulatory Visit (INDEPENDENT_AMBULATORY_CARE_PROVIDER_SITE_OTHER): Payer: Medicare Other | Admitting: *Deleted

## 2018-04-05 VITALS — BP 126/72 | HR 70 | Resp 18 | Ht 64.0 in | Wt 130.0 lb

## 2018-04-05 DIAGNOSIS — Z0000 Encounter for general adult medical examination without abnormal findings: Secondary | ICD-10-CM | POA: Diagnosis not present

## 2018-04-05 DIAGNOSIS — Z23 Encounter for immunization: Secondary | ICD-10-CM | POA: Diagnosis not present

## 2018-04-05 NOTE — Progress Notes (Addendum)
Subjective:   Amanda Cole is a 77 y.o. female who presents for Medicare Annual (Subsequent) preventive examination.  Review of Systems:  No ROS.  Medicare Wellness Visit. Additional risk factors are reflected in the social history.  Cardiac Risk Factors include: advanced age (>57men, >20 women) Sleep patterns: feels rested on waking, gets up 1-2 times nightly to void and sleeps 3-5 hours nightly. Patient reports insomnia issues, discussed recommended sleep tips.   Home Safety/Smoke Alarms: Feels safe in home. Smoke alarms in place.  Living environment; residence and Firearm Safety: 2-story house, no firearms. Lives with husband, no needs for DME, good support system Seat Belt Safety/Bike Helmet: Wears seat belt.     Objective:     Vitals: BP 126/72   Pulse 70   Resp 18   Ht 5\' 4"  (1.626 m)   Wt 130 lb (59 kg)   SpO2 98%   BMI 22.31 kg/m   Body mass index is 22.31 kg/m.  Advanced Directives 04/05/2018  Does Patient Have a Medical Advance Directive? Yes  Type of Paramedic of [place];Living will  Copy of [place] in Chart? No - copy requested    Tobacco Social History   Tobacco Use  Smoking Status Never Smoker  Smokeless Tobacco Never Used     Counseling given: Not Answered  Past Medical History:  Diagnosis Date  . Cataract    Past Surgical History:  Procedure Laterality Date  . CATARACT EXTRACTION     B   Family History  Problem Relation Age of Onset  . Arthritis Mother        RA   Social History   Socioeconomic History  . Marital status: Married    Spouse name: Not on file  . Number of children: 2  . Years of education: Not on file  . Highest education level: Not on file  Occupational History  . Not on file  Social Needs  . Financial resource strain: Not hard at all  . Food insecurity:    Worry: Never true    Inability: Never true  . Transportation needs:    Medical: No    Non-medical: No    Tobacco Use  . Smoking status: Never Smoker  . Smokeless tobacco: Never Used  Substance and Sexual Activity  . Alcohol use: No  . Drug use: No  . Sexual activity: Yes  Lifestyle  . Physical activity:    Days per week: 0 days    Minutes per session: 0 min  . Stress: Not at all  Relationships  . Social connections:    Talks on phone: More than three times a week    Gets together: More than three times a week    Attends religious service: More than 4 times per year    Active member of club or organization: Yes    Attends meetings of clubs or organizations: More than 4 times per year    Relationship status: Married  Other Topics Concern  . Not on file  Social History Narrative  . Not on file    Outpatient Encounter Medications as of 04/05/2018  Medication Sig  . fluticasone (CUTIVATE) 0.05 % cream as needed. Reported on 06/22/2015  . fluticasone (FLONASE) 50 MCG/ACT nasal spray Place 1 spray into both nostrils daily. (Patient taking differently: Place 1 spray into both nostrils as needed. )  . ibuprofen (ADVIL,MOTRIN) 200 MG tablet Take 400 mg by mouth 2 (two) times daily.  Marland Kitchen  METRONIDAZOLE, TOPICAL, 0.75 % LOTN On face bid  . [DISCONTINUED] ergocalciferol (VITAMIN D2) 50000 units capsule Take 1 capsule (50,000 Units total) by mouth once a week.  . [DISCONTINUED] clotrimazole-betamethasone (LOTRISONE) cream Apply 1 application topically 2 (two) times daily. (Patient not taking: Reported on 04/05/2018)  . [DISCONTINUED] Cyanocobalamin (VITAMIN B-12) 1000 MCG SUBL Place 1 tablet (1,000 mcg total) under the tongue daily. (Patient not taking: Reported on 04/05/2018)   No facility-administered encounter medications on file as of 04/05/2018.     Activities of Daily Living In your present state of health, do you have any difficulty performing the following activities: 04/05/2018  Hearing? Y  Vision? N  Difficulty concentrating or making decisions? N  Walking or climbing stairs? N   Dressing or bathing? N  Doing errands, shopping? N  Preparing Food and eating ? N  Using the Toilet? N  In the past six months, have you accidently leaked urine? N  Do you have problems with loss of bowel control? N  Managing your Medications? N  Managing your Finances? N  Housekeeping or managing your Housekeeping? N  Some recent data might be hidden    Patient Care Team: Plotnikov, Evie Lacks, MD as PCP - General Richmond Campbell, MD as Consulting Physician (Gastroenterology) Hurley Cisco, MD as Consulting Physician (Rheumatology)    Assessment:   This is a routine wellness examination for Amanda Cole. Physical assessment deferred to PCP.   Exercise Activities and Dietary recommendations Current Exercise Habits: Home exercise routine, Type of exercise: walking, Time (Minutes): 30, Frequency (Times/Week): 4, Weekly Exercise (Minutes/Week): 120, Intensity: Mild, Exercise limited by: orthopedic condition(s)  Diet (meal preparation, eat out, water intake, caffeinated beverages, dairy products, fruits and vegetables): in general, a "healthy" diet  , well balanced  eats a variety of fruits and vegetables daily, limits salt, fat/cholesterol, sugar,carbohydrates,caffeine. Encouraged patient to increase daily water and healthy fluid intake.  Goals    . Patient Stated     Maintain current health status.       Fall Risk  Depression Screen PHQ 2/9 Scores 04/05/2018  PHQ - 2 Score 1  PHQ- 9 Score 6     Cognitive Function MMSE - Mini Mental State Exam 04/05/2018  Orientation to time 5  Orientation to Place 5  Registration 3  Attention/ Calculation 5  Recall 2  Language- name 2 objects 2  Language- repeat 1  Language- follow 3 step command 3  Language- read & follow direction 1  Write a sentence 1  Copy design 1  Total score 29        Immunization History  Administered Date(s) Administered  . Pneumococcal Conjugate-13 04/05/2018  . Pneumococcal Polysaccharide-23  03/25/2013  . Td 03/01/2012  . Zoster 03/01/2012   Screening Tests Health Maintenance  Topic Date Due  . INFLUENZA VACCINE  08/24/2018 (Originally 12/13/2017)  . DEXA SCAN  04/06/2019 (Originally 11/10/2003)  . TETANUS/TDAP  03/01/2022  . PNA vac Low Risk Adult  Completed      Plan:     Continue doing brain stimulating activities (puzzles, reading, adult coloring books, staying active) to keep memory sharp.   Continue to eat heart healthy diet (full of fruits, vegetables, whole grains, lean protein, water--limit salt, fat, and sugar intake) and increase physical activity as tolerated.  I have personally reviewed and noted the following in the patient's chart:   . Medical and social history . Use of alcohol, tobacco or illicit drugs  . Current medications and supplements . Functional ability  and status . Nutritional status . Physical activity . Advanced directives . List of other physicians . Vitals . Screenings to include cognitive, depression, and falls . Referrals and appointments  In addition, I have reviewed and discussed with patient certain preventive protocols, quality metrics, and best practice recommendations. A written personalized care plan for preventive services as well as general preventive health recommendations were provided to patient.     Michiel Cowboy, RN  04/05/2018    Medical screening examination/treatment/procedure(s) were performed by non-physician practitioner and as supervising physician I was immediately available for consultation/collaboration. I agree with above. Cathlean Cower, MD

## 2018-04-05 NOTE — Patient Instructions (Addendum)
Continue doing brain stimulating activities (puzzles, reading, adult coloring books, staying active) to keep memory sharp.   Continue to eat heart healthy diet (full of fruits, vegetables, whole grains, lean protein, water--limit salt, fat, and sugar intake) and increase physical activity as tolerated.  Health Maintenance, Female Adopting a healthy lifestyle and getting preventive care can go a long way to promote health and wellness. Talk with your health care provider about what schedule of regular examinations is right for you. This is a good chance for you to check in with your provider about disease prevention and staying healthy. In between checkups, there are plenty of things you can do on your own. Experts have done a lot of research about which lifestyle changes and preventive measures are most likely to keep you healthy. Ask your health care provider for more information. Weight and diet Eat a healthy diet  Be sure to include plenty of vegetables, fruits, low-fat dairy products, and lean protein.  Do not eat a lot of foods high in solid fats, added sugars, or salt.  Get regular exercise. This is one of the most important things you can do for your health. ? Most adults should exercise for at least 150 minutes each week. The exercise should increase your heart rate and make you sweat (moderate-intensity exercise). ? Most adults should also do strengthening exercises at least twice a week. This is in addition to the moderate-intensity exercise.  Maintain a healthy weight  Body mass index (BMI) is a measurement that can be used to identify possible weight problems. It estimates body fat based on height and weight. Your health care provider can help determine your BMI and help you achieve or maintain a healthy weight.  For females 40 years of age and older: ? A BMI below 18.5 is considered underweight. ? A BMI of 18.5 to 24.9 is normal. ? A BMI of 25 to 29.9 is considered  overweight. ? A BMI of 30 and above is considered obese.  Watch levels of cholesterol and blood lipids  You should start having your blood tested for lipids and cholesterol at 79 years of age, then have this test every 5 years.  You may need to have your cholesterol levels checked more often if: ? Your lipid or cholesterol levels are high. ? You are older than 79 years of age. ? You are at high risk for heart disease.  Cancer screening Lung Cancer  Lung cancer screening is recommended for adults 53-15 years old who are at high risk for lung cancer because of a history of smoking.  A yearly low-dose CT scan of the lungs is recommended for people who: ? Currently smoke. ? Have quit within the past 15 years. ? Have at least a 30-pack-year history of smoking. A pack year is smoking an average of one pack of cigarettes a day for 1 year.  Yearly screening should continue until it has been 15 years since you quit.  Yearly screening should stop if you develop a health problem that would prevent you from having lung cancer treatment.  Breast Cancer  Practice breast self-awareness. This means understanding how your breasts normally appear and feel.  It also means doing regular breast self-exams. Let your health care provider know about any changes, no matter how small.  If you are in your 20s or 30s, you should have a clinical breast exam (CBE) by a health care provider every 1-3 years as part of a regular health exam.  If  you are 40 or older, have a CBE every year. Also consider having a breast X-ray (mammogram) every year.  If you have a family history of breast cancer, talk to your health care provider about genetic screening.  If you are at high risk for breast cancer, talk to your health care provider about having an MRI and a mammogram every year.  Breast cancer gene (BRCA) assessment is recommended for women who have family members with BRCA-related cancers. BRCA-related cancers  include: ? Breast. ? Ovarian. ? Tubal. ? Peritoneal cancers.  Results of the assessment will determine the need for genetic counseling and BRCA1 and BRCA2 testing.  Cervical Cancer Your health care provider may recommend that you be screened regularly for cancer of the pelvic organs (ovaries, uterus, and vagina). This screening involves a pelvic examination, including checking for microscopic changes to the surface of your cervix (Pap test). You may be encouraged to have this screening done every 3 years, beginning at age 34.  For women ages 5-65, health care providers may recommend pelvic exams and Pap testing every 3 years, or they may recommend the Pap and pelvic exam, combined with testing for human papilloma virus (HPV), every 5 years. Some types of HPV increase your risk of cervical cancer. Testing for HPV may also be done on women of any age with unclear Pap test results.  Other health care providers may not recommend any screening for nonpregnant women who are considered low risk for pelvic cancer and who do not have symptoms. Ask your health care provider if a screening pelvic exam is right for you.  If you have had past treatment for cervical cancer or a condition that could lead to cancer, you need Pap tests and screening for cancer for at least 20 years after your treatment. If Pap tests have been discontinued, your risk factors (such as having a new sexual partner) need to be reassessed to determine if screening should resume. Some women have medical problems that increase the chance of getting cervical cancer. In these cases, your health care provider may recommend more frequent screening and Pap tests.  Colorectal Cancer  This type of cancer can be detected and often prevented.  Routine colorectal cancer screening usually begins at 79 years of age and continues through 79 years of age.  Your health care provider may recommend screening at an earlier age if you have risk factors  for colon cancer.  Your health care provider may also recommend using home test kits to check for hidden blood in the stool.  A small camera at the end of a tube can be used to examine your colon directly (sigmoidoscopy or colonoscopy). This is done to check for the earliest forms of colorectal cancer.  Routine screening usually begins at age 70.  Direct examination of the colon should be repeated every 5-10 years through 79 years of age. However, you may need to be screened more often if early forms of precancerous polyps or small growths are found.  Skin Cancer  Check your skin from head to toe regularly.  Tell your health care provider about any new moles or changes in moles, especially if there is a change in a mole's shape or color.  Also tell your health care provider if you have a mole that is larger than the size of a pencil eraser.  Always use sunscreen. Apply sunscreen liberally and repeatedly throughout the day.  Protect yourself by wearing long sleeves, pants, a wide-brimmed hat, and sunglasses  whenever you are outside.  Heart disease, diabetes, and high blood pressure  High blood pressure causes heart disease and increases the risk of stroke. High blood pressure is more likely to develop in: ? People who have blood pressure in the high end of the normal range (130-139/85-89 mm Hg). ? People who are overweight or obese. ? People who are African American.  If you are 64-28 years of age, have your blood pressure checked every 3-5 years. If you are 58 years of age or older, have your blood pressure checked every year. You should have your blood pressure measured twice-once when you are at a hospital or clinic, and once when you are not at a hospital or clinic. Record the average of the two measurements. To check your blood pressure when you are not at a hospital or clinic, you can use: ? An automated blood pressure machine at a pharmacy. ? A home blood pressure monitor.  If  you are between 46 years and 42 years old, ask your health care provider if you should take aspirin to prevent strokes.  Have regular diabetes screenings. This involves taking a blood sample to check your fasting blood sugar level. ? If you are at a normal weight and have a low risk for diabetes, have this test once every three years after 79 years of age. ? If you are overweight and have a high risk for diabetes, consider being tested at a younger age or more often. Preventing infection Hepatitis B  If you have a higher risk for hepatitis B, you should be screened for this virus. You are considered at high risk for hepatitis B if: ? You were born in a country where hepatitis B is common. Ask your health care provider which countries are considered high risk. ? Your parents were born in a high-risk country, and you have not been immunized against hepatitis B (hepatitis B vaccine). ? You have HIV or AIDS. ? You use needles to inject street drugs. ? You live with someone who has hepatitis B. ? You have had sex with someone who has hepatitis B. ? You get hemodialysis treatment. ? You take certain medicines for conditions, including cancer, organ transplantation, and autoimmune conditions.  Hepatitis C  Blood testing is recommended for: ? Everyone born from [birth year] through 1967. ? Anyone with known risk factors for hepatitis C.  Sexually transmitted infections (STIs)  You should be screened for sexually transmitted infections (STIs) including gonorrhea and chlamydia if: ? You are sexually active and are younger than 79 years of age. ? You are older than 79 years of age and your health care provider tells you that you are at risk for this type of infection. ? Your sexual activity has changed since you were last screened and you are at an increased risk for chlamydia or gonorrhea. Ask your health care provider if you are at risk.  If you do not have HIV, but are at risk, it may be recommended  that you take a prescription medicine daily to prevent HIV infection. This is called pre-exposure prophylaxis (PrEP). You are considered at risk if: ? You are sexually active and do not regularly use condoms or know the HIV status of your partner(s). ? You take drugs by injection. ? You are sexually active with a partner who has HIV.  Talk with your health care provider about whether you are at high risk of being infected with HIV. If you choose to begin PrEP, you should  first be tested for HIV. You should then be tested every 3 months for as long as you are taking PrEP. Pregnancy  If you are premenopausal and you may become pregnant, ask your health care provider about preconception counseling.  If you may become pregnant, take 400 to 800 micrograms (mcg) of folic acid every day.  If you want to prevent pregnancy, talk to your health care provider about birth control (contraception). Osteoporosis and menopause  Osteoporosis is a disease in which the bones lose minerals and strength with aging. This can result in serious bone fractures. Your risk for osteoporosis can be identified using a bone density scan.  If you are 38 years of age or older, or if you are at risk for osteoporosis and fractures, ask your health care provider if you should be screened.  Ask your health care provider whether you should take a calcium or vitamin D supplement to lower your risk for osteoporosis.  Menopause may have certain physical symptoms and risks.  Hormone replacement therapy may reduce some of these symptoms and risks. Talk to your health care provider about whether hormone replacement therapy is right for you. Follow these instructions at home:  Schedule regular health, dental, and eye exams.  Stay current with your immunizations.  Do not use any tobacco products including cigarettes, chewing tobacco, or electronic cigarettes.  If you are pregnant, do not drink alcohol.  If you are  breastfeeding, limit how much and how often you drink alcohol.  Limit alcohol intake to no more than 1 drink per day for nonpregnant women. One drink equals 12 ounces of beer, 5 ounces of wine, or 1 ounces of hard liquor.  Do not use street drugs.  Do not share needles.  Ask your health care provider for help if you need support or information about quitting drugs.  Tell your health care provider if you often feel depressed.  Tell your health care provider if you have ever been abused or do not feel safe at home. This information is not intended to replace advice given to you by your health care provider. Make sure you discuss any questions you have with your health care provider. Document Released: 11/14/2010 Document Revised: 10/07/2015 Document Reviewed: 02/02/2015 Elsevier Interactive Patient Education  [employer].

## 2018-04-09 ENCOUNTER — Ambulatory Visit: Payer: Medicare Other | Admitting: Internal Medicine

## 2018-05-03 ENCOUNTER — Encounter: Payer: Self-pay | Admitting: Family

## 2018-05-03 ENCOUNTER — Other Ambulatory Visit: Payer: Medicare Other

## 2018-05-03 ENCOUNTER — Ambulatory Visit (INDEPENDENT_AMBULATORY_CARE_PROVIDER_SITE_OTHER): Payer: Medicare Other | Admitting: Family

## 2018-05-03 ENCOUNTER — Other Ambulatory Visit (INDEPENDENT_AMBULATORY_CARE_PROVIDER_SITE_OTHER): Payer: Medicare Other

## 2018-05-03 VITALS — BP 116/74 | HR 65 | Temp 97.9°F | Ht 64.0 in | Wt 128.1 lb

## 2018-05-03 DIAGNOSIS — R040 Epistaxis: Secondary | ICD-10-CM

## 2018-05-03 DIAGNOSIS — R300 Dysuria: Secondary | ICD-10-CM | POA: Diagnosis not present

## 2018-05-03 LAB — CBC WITH DIFFERENTIAL/PLATELET
Basophils Absolute: 0 10*3/uL (ref 0.0–0.1)
Basophils Relative: 0.5 % (ref 0.0–3.0)
Eosinophils Absolute: 0.2 10*3/uL (ref 0.0–0.7)
Eosinophils Relative: 3.1 % (ref 0.0–5.0)
HCT: 40.2 % (ref 36.0–46.0)
Hemoglobin: 13.5 g/dL (ref 12.0–15.0)
Lymphocytes Relative: 27.2 % (ref 12.0–46.0)
Lymphs Abs: 1.5 10*3/uL (ref 0.7–4.0)
MCHC: 33.7 g/dL (ref 30.0–36.0)
MCV: 87.4 fl (ref 78.0–100.0)
Monocytes Absolute: 0.7 10*3/uL (ref 0.1–1.0)
Monocytes Relative: 12.5 % — ABNORMAL HIGH (ref 3.0–12.0)
NEUTROS PCT: 56.7 % (ref 43.0–77.0)
Neutro Abs: 3.1 10*3/uL (ref 1.4–7.7)
Platelets: 214 10*3/uL (ref 150.0–400.0)
RBC: 4.59 Mil/uL (ref 3.87–5.11)
RDW: 13.9 % (ref 11.5–15.5)
WBC: 5.4 10*3/uL (ref 4.0–10.5)

## 2018-05-03 LAB — POC URINALSYSI DIPSTICK (AUTOMATED)
Bilirubin, UA: NEGATIVE
Blood, UA: NEGATIVE
Glucose, UA: NEGATIVE
KETONES UA: NEGATIVE
Leukocytes, UA: NEGATIVE
Nitrite, UA: NEGATIVE
PROTEIN UA: NEGATIVE
Spec Grav, UA: 1.015 (ref 1.010–1.025)
Urobilinogen, UA: 0.2 E.U./dL
pH, UA: 6 (ref 5.0–8.0)

## 2018-05-03 LAB — COMPREHENSIVE METABOLIC PANEL
ALT: 20 U/L (ref 0–35)
AST: 22 U/L (ref 0–37)
Albumin: 4.3 g/dL (ref 3.5–5.2)
Alkaline Phosphatase: 72 U/L (ref 39–117)
BUN: 24 mg/dL — ABNORMAL HIGH (ref 6–23)
CO2: 25 mEq/L (ref 19–32)
Calcium: 9 mg/dL (ref 8.4–10.5)
Chloride: 104 mEq/L (ref 96–112)
Creatinine, Ser: 1.17 mg/dL (ref 0.40–1.20)
GFR: 47.37 mL/min — AB (ref >60.00)
GLUCOSE: 93 mg/dL (ref 70–99)
Potassium: 4.1 mEq/L (ref 3.5–5.1)
Sodium: 138 mEq/L (ref 135–145)
Total Bilirubin: 0.3 mg/dL (ref 0.2–1.2)
Total Protein: 7 g/dL (ref 6.0–8.3)

## 2018-05-03 MED ORDER — CEFUROXIME AXETIL 500 MG PO TABS: 500.0000 mg | ORAL_TABLET | Freq: Two times a day (BID) | ORAL | 0 refills | Status: DC

## 2018-05-03 NOTE — Progress Notes (Signed)
Amanda Cole is a 77 y.o. female with the following history as recorded in [employer]:  Patient Active Problem List   Diagnosis Date Noted  . Dysuria 05/16/2016  . Seborrheic dermatitis 06/22/2015  . Clavicle enlargement 05/22/2014  . Well adult exam 02/29/2012  . Hand pain, left 02/29/2012  . LOW BACK PAIN, ACUTE 03/07/2010  . Vitamin B12 deficiency 02/01/2009  . PALPITATIONS 02/01/2009  . CYSTITIS 05/29/2008  . HIP PAIN 05/29/2008  . HAND PAIN, LEFT 05/29/2008  . HEADACHE 01/10/2008  . RASH-NONVESICULAR 11/01/2007  . HERPES ZOSTER OPHTHALMICUS 10/12/2007  . HEARING IMPAIRMENT 10/03/2007  . ALLERGIC RHINITIS 10/03/2007  . Chronic inflammatory arthritis 10/03/2007  . HYPERLIPIDEMIA 05/14/2007  . SINUSITIS- ACUTE-NOS 05/14/2007    Current Outpatient Medications  Medication Sig Dispense Refill  . fluticasone (CUTIVATE) 0.05 % cream as needed. Reported on 06/22/2015  3  . fluticasone (FLONASE) 50 MCG/ACT nasal spray Place 1 spray into both nostrils daily. (Patient taking differently: Place 1 spray into both nostrils as needed. ) 48 g 3  . ibuprofen (ADVIL,MOTRIN) 200 MG tablet Take 400 mg by mouth 2 (two) times daily.    Marland Kitchen METRONIDAZOLE, TOPICAL, 0.75 % LOTN On face bid 59 mL 3  . cefUROXime (CEFTIN) 500 MG tablet Take 1 tablet (500 mg total) by mouth 2 (two) times daily with a meal. 10 tablet 0   No current facility-administered medications for this visit.     Allergies: Nitrofurantoin and Tetracycline  Past Medical History:  Diagnosis Date  . Cataract     Past Surgical History:  Procedure Laterality Date  . CATARACT EXTRACTION     B    Family History  Problem Relation Age of Onset  . Arthritis Mother        RA    Social History   Tobacco Use  . Smoking status: Never Smoker  . Smokeless tobacco: Never Used  Substance Use Topics  . Alcohol use: No    Subjective:  Patient presents with concerns for possible UTI; symptoms x 1 day; no vaginal bleeding or discharge;  no fever; no back or flank pain; has not had a UTI in a number of years;   Also mentions that has had 3 different nose bleeds in the past 1-2 month; not prone to nose bleeds; not currently using any type of humidifier at home; able to get bleeding to stop- seems to start without provocation  however.      Objective:  Vitals:   05/03/18 1428  BP: 116/74  Pulse: 65  Temp: 97.9 F (36.6 C)  TempSrc: Oral  SpO2: 98%  Weight: 128 lb 1.9 oz (58.1 kg)  Height: '5\' 4"'$  (1.626 m)    General: Well developed, well nourished, in no acute distress  Skin : Warm and dry.  Head: Normocephalic and atraumatic  Eyes: Sclera and conjunctiva clear; pupils round and reactive to light; extraocular movements intact  Ears: External normal; canals clear; tympanic membranes normal  Oropharynx: Pink, supple. No suspicious lesions  Neck: Supple without thyromegaly, adenopathy  Lungs: Respirations unlabored; Neurologic: Alert and oriented; speech intact; face symmetrical; moves all extremities well; CNII-XII intact without focal deficit   Assessment:  1. Dysuria   2. Epistaxis     Plan:  1. Suspect UTI; check U/A and urine culture; Rx for Ceftin 500 mg bid x 5 days; 2. Physical exam is reassuring; check CBC, CMP today; encouraged use of humidifier, nasal saline spray; may need to refer to ENT if bleeding persists.  No follow-ups on file.  Orders Placed This Encounter  Procedures  . Urine Culture    Standing Status:   Future    Number of Occurrences:   1    Standing Expiration Date:   05/03/2019  . CBC w/Diff    Standing Status:   Future    Number of Occurrences:   1    Standing Expiration Date:   05/03/2019  . Comp Met (CMET)    Standing Status:   Future    Number of Occurrences:   1    Standing Expiration Date:   05/03/2019  . POCT Urinalysis Dipstick (Automated)    Requested Prescriptions   Signed Prescriptions Disp Refills  . cefUROXime (CEFTIN) 500 MG tablet 10 tablet 0    Sig: Take 1  tablet (500 mg total) by mouth 2 (two) times daily with a meal.

## 2018-05-04 LAB — URINE CULTURE
MICRO NUMBER: 91526476
Result:: NO GROWTH
SPECIMEN QUALITY:: ADEQUATE

## 2018-05-23 ENCOUNTER — Telehealth: Payer: Self-pay | Admitting: Internal Medicine

## 2018-05-23 DIAGNOSIS — R040 Epistaxis: Secondary | ICD-10-CM

## 2018-05-23 NOTE — Telephone Encounter (Signed)
Copied from [place] [id number]. Topic: Referral - Request for Referral >> May 23, 2018  1:48 PM Windy Kalata wrote: Has patient seen PCP for this complaint? Yes.   Yes If NO, is insurance requiring patient see PCP for this issue before PCP can refer them? Referral for which specialty: ENT Preferred provider/office: Please recommend someone Reason for referral: To many nose bleeds  Best call back is [phone]

## 2018-05-23 NOTE — Telephone Encounter (Signed)
Please advise 

## 2018-05-24 NOTE — Telephone Encounter (Signed)
Ok Thx 

## 2018-05-24 NOTE — Addendum Note (Signed)
Addended by: Cassandria Anger on: 05/24/2018 10:42 AM   Modules accepted: Orders

## 2018-05-30 DIAGNOSIS — J343 Hypertrophy of nasal turbinates: Secondary | ICD-10-CM | POA: Diagnosis not present

## 2018-05-30 DIAGNOSIS — R040 Epistaxis: Secondary | ICD-10-CM | POA: Insufficient documentation

## 2018-05-30 DIAGNOSIS — Z7289 Other problems related to lifestyle: Secondary | ICD-10-CM | POA: Diagnosis not present

## 2018-06-26 ENCOUNTER — Ambulatory Visit (INDEPENDENT_AMBULATORY_CARE_PROVIDER_SITE_OTHER): Payer: Medicare Other | Admitting: Family Medicine

## 2018-06-26 ENCOUNTER — Encounter: Payer: Self-pay | Admitting: Family Medicine

## 2018-06-26 VITALS — BP 129/68 | HR 75 | Temp 98.0°F | Ht 64.0 in | Wt 130.4 lb

## 2018-06-26 DIAGNOSIS — Z8261 Family history of arthritis: Secondary | ICD-10-CM | POA: Diagnosis not present

## 2018-06-26 DIAGNOSIS — G479 Sleep disorder, unspecified: Secondary | ICD-10-CM | POA: Insufficient documentation

## 2018-06-26 DIAGNOSIS — M79641 Pain in right hand: Secondary | ICD-10-CM | POA: Insufficient documentation

## 2018-06-26 DIAGNOSIS — M79642 Pain in left hand: Secondary | ICD-10-CM | POA: Diagnosis not present

## 2018-06-26 MED ORDER — MELATONIN 3 MG SL SUBL: 3.0000 mg | SUBLINGUAL_TABLET | Freq: Every evening | SUBLINGUAL | Status: DC | PRN

## 2018-06-26 NOTE — Progress Notes (Signed)
New patient office visit note:  Impression and Recommendations:    1. Bilateral hand pain- chronic in DIP and PIP jts   2. Sleeping difficulties   3. Family history of rheumatoid arthritis      1. Encounter to Establish Care - Extensive discussion held with patient regarding establishing as a new patient.  Discussed policies and practices here at the clinic, and answered all questions about care team and health management during appointment.  - Discussed need for patient to continue to obtain management and screenings with all established specialists.  Educated patient at length about the critical importance of keeping health maintenance up to date.  - Participated in lengthy conversation and all questions were answered.  2. Bilateral Hand Pain - Arthritis & Potential Rheumatoid Arthritis - Discussed need for referral to another rheumatologist for second opinion. - Recommended re-evaluation with a specialist.  - Referral provided to rheumatology today, for rheumatoid evaluation.  3. Sleeping Difficulties - Recommended use of 3-10 mg melatonin per night.  - Prudent sleep hygiene habits reviewed at length today. - Avised patient to avoid blue light, and avoid sleeping during the day, etc. - Strongly advised patient to go to bed at the same time every night, to establish a routine.  - Discussed options for sleep meditation at length during appointment today.  - Encouraged patient to exercise more during the day..    Education and routine counseling performed. Handouts provided.   Do not remove these below:  Orders Placed This Encounter  Procedures  . Ambulatory referral to Rheumatology    Meds ordered this encounter  Medications  . Melatonin 3 MG SUBL    Sig: Place 3-9 mg under the tongue at bedtime as needed. sleep    Medications Discontinued During This Encounter  Medication Reason  . cefUROXime (CEFTIN) 500 MG tablet Completed Course  . ibuprofen  (ADVIL,MOTRIN) 200 MG tablet       Gross side effects, risk and benefits, and alternatives of medications discussed with patient.  Patient is aware that all medications have potential side effects and we are unable to predict every side effect or drug-drug interaction that may occur.  Expresses verbal understanding and consents to current therapy plan and treatment regimen.  Return for f/up after seen by Rheum; come fasting next OV.  Please see AVS handed out to patient at the end of our visit for further patient instructions/ counseling done pertaining to today's office visit.    Note:  This document was prepared using Dragon voice recognition software and may include unintentional dictation errors.   This document serves as a record of services personally performed by Mellody Dance, DO. It was created on her behalf by Toni Amend, a trained medical scribe. The creation of this record is based on the scribe's personal observations and the provider's statements to them.   I have reviewed the above medical documentation for accuracy and completeness and I concur.  Mellody Dance, DO 06/26/2018 10:08 PM       -------------------------------------------------------------------------------------------------------------------------------    Subjective:    Chief complaint:   Chief Complaint  Patient presents with  . Establish Care   HPI: Amanda Cole is a pleasant 78 y.o. female who presents to [facility] today to review their medical history with me and establish care.   I asked the patient to review their chronic problem list with me to ensure everything was updated and accurate.  All recent office visits with other providers, any medical records that patient brought in etc  - I reviewed today.     We asked pt to get [place] their medical records from [facility] providers/ specialists that they had seen within the past 3-5 years- if they are in  private practice and/or do not work for [employer], [facility], [facility], [facility] or [employer] owned practice.  Told them to call their specialists to clarify this if they are not sure.    Patient states she is here today to meet the doctor and decide whether she wants to change primary care providers or not.  Former PCP was [employer] with [facility].  Has been seeing Dr. Alain Marion for years, but is thinking about changing PCP because of convenience; she lives so close to the clinic here.  Notes she wouldn't go to see her former PCP but once every two or three years.  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 Used to play golf and tennis. Now has too much arthritis.  Tobacco Use Never smoker.  Alcohol Use Drinks very rarely; "I don't like it. I only do it when there's peer pressure."  No caffeine intake.  Satisfied with her weight.  Does not currently exercise due to her arthritis.  Family History Mother with rheumatoid arthritis.  Mother died at age 71.  Was diagnosed with rheumatoid arthritis in her early 57's.  Surgical History Past Surgical History:  Procedure Laterality Date  . CATARACT EXTRACTION     B   Complete Hysterectomy in past.  Past Medical History States that after her experience watching her mother's health decline, "I don't want to take anything that I don't have to take."  Aside from arthritis and insomnia, denies other chronic health concerns or abnormalities.  States that her bone density has always been good.  She is not managed on any chronic medications.  - Complete Hysterectomy & History of Hormone Replacement on Premarin Patient states she took premarin for about twenty years after her hysterectomy.  She quit taking this in 2005, and notes she's had insomnia ever since.  - Insomnia Since Discontinuing Premarin Has tried CBD oil as a sleep aid; "that does not do much good for me." Has not found a  solution to her sleeplessness at this time.  Notes she does not nap during the day. She is ready to go to sleep at 8 PM, often actually going to sleep at 9:30-10:00 PM.  Went to bed at 10:00 PM last night and woke up at 2:00 AM.  When she wakes up, she often lies there trying to go back to sleep, then goes into the den and turns the TV on, and tries to go back to sleep.  - Osteoarthritis Used to see a rheumatologist, but after he retired, she stopped going to Rheumatology.  Patient has been tested for rheumatoid arthritis, and was told she does not have it.  Arthritis is located in her hands and her hips; notes several locations.  Used to take ibuprofen twice per day, 2 in the morning, 2 at night.  Had her last wellness examination in December 2019, and states she stopped taking ibuprofen because her kidneys were "slightly out of the range of normal."  Hasn't taken anything for pain since discontinuing ibuprofen.  States "I do need to take something [for the pain]," and "I want the thing least damaging to my kidneys."  Notes topical ointments provide limited  relief for her symptoms.  Uses CBD cream at night when she goes to bed, "maybe it's helping, I don't know."  Says she doesn't know how bad it would be if she didn't use it.  To help alleviate her arthritis pain, Dr. Alain Marion encouraged the patient to exercise.  Patient was engaging in yoga in the past.  States she "couldn't do a lot of what they wanted me to do."  Patient had shoulder surgery with Dr. Durward Fortes in the past, referred by rheumatology.  Denies swelling in the lower extremities.   Wt Readings from Last 3 Encounters:  06/26/18 130 lb 6.4 oz (59.1 kg)  05/03/18 128 lb 1.9 oz (58.1 kg)  04/05/18 130 lb (59 kg)   BP Readings from Last 3 Encounters:  06/26/18 129/68  05/03/18 116/74  04/05/18 126/72   Pulse Readings from Last 3 Encounters:  06/26/18 75  05/03/18 65  04/05/18 70   BMI Readings from Last 3 Encounters:    06/26/18 22.38 kg/m  05/03/18 21.99 kg/m  04/05/18 22.31 kg/m    Patient Care Team    Relationship Specialty Notifications Start End  Mellody Dance, DO PCP - General Family Medicine  06/26/18   Richmond Campbell, MD Consulting Physician Gastroenterology  03/25/13   Hurley Cisco, MD Consulting Physician Rheumatology  03/25/13   Plotnikov, Evie Lacks, MD Consulting Physician Internal Medicine  06/26/18   Levy Sjogren, MD Referring Physician Dermatology  06/26/18   Warden Fillers, MD Consulting Physician Ophthalmology  06/26/18     Patient Active Problem List   Diagnosis Date Noted  . CYSTITIS 05/29/2008    Priority: Medium  . Seborrheic dermatitis 06/22/2015    Priority: Low  . Vitamin B12 deficiency 02/01/2009    Priority: Low  . Sleep disorder 06/26/2018  . Bilateral hand pain- chronic in DIP and PIP jts 06/26/2018  . Family history of rheumatoid arthritis 06/26/2018  . Dysuria 05/16/2016  . Clavicle enlargement 05/22/2014  . Well adult exam 02/29/2012  . Hand pain, left 02/29/2012  . LOW BACK PAIN, ACUTE 03/07/2010  . PALPITATIONS 02/01/2009  . HIP PAIN 05/29/2008  . HAND PAIN, LEFT 05/29/2008  . HEADACHE 01/10/2008  . RASH-NONVESICULAR 11/01/2007  . HERPES ZOSTER OPHTHALMICUS 10/12/2007  . HEARING IMPAIRMENT 10/03/2007  . ALLERGIC RHINITIS 10/03/2007  . Chronic inflammatory arthritis 10/03/2007  . HYPERLIPIDEMIA 05/14/2007  . SINUSITIS- ACUTE-NOS 05/14/2007       As reported by pt:  Past Medical History:  Diagnosis Date  . Cataract      Past Surgical History:  Procedure Laterality Date  . CATARACT EXTRACTION     B     Family History  Problem Relation Age of Onset  . Arthritis Mother        RA     Social History   Substance and Sexual Activity  Drug Use No     Social History   Substance and Sexual Activity  Alcohol Use No     Social History   Tobacco Use  Smoking Status Never Smoker  Smokeless Tobacco Never  Used     No outpatient medications have been marked as taking for the 06/26/18 encounter (Office Visit) with Mellody Dance, DO.    Allergies: Nitrofurantoin and Tetracycline   Review of Systems  Constitutional: Negative for chills, diaphoresis, fever, malaise/fatigue and weight loss.  HENT: Negative for congestion, sore throat and tinnitus.   Eyes: Negative for blurred vision, double vision and photophobia.  Respiratory: Negative for cough and wheezing.  Cardiovascular: Negative for chest pain and palpitations.  Gastrointestinal: Negative for blood in stool, diarrhea, nausea and vomiting.  Genitourinary: Negative for dysuria, frequency and urgency.       Chronic leaking urine.  Musculoskeletal: Positive for joint pain (chronic) and myalgias (chronic).  Skin: Negative for itching and rash.  Neurological: Negative for dizziness, focal weakness, weakness and headaches.  Endo/Heme/Allergies: Positive for environmental allergies (chronic). Negative for polydipsia. Does not bruise/bleed easily.  Psychiatric/Behavioral: Negative for depression and memory loss. The patient is not nervous/anxious and does not have insomnia.        Chronic sleep problems.        Objective:   Blood pressure 129/68, pulse 75, temperature 98 F (36.7 C), height '5\' 4"'$  (1.626 m), weight 130 lb 6.4 oz (59.1 kg), SpO2 98 %. Body mass index is 22.38 kg/m. General: Well Developed, well nourished, and in no acute distress.  Neuro: Alert and oriented x3, extra-ocular muscles intact, sensation grossly intact.  HEENT:[place]/AT, PERRLA, neck supple, No carotid bruits Skin: no gross rashes  Cardiac: Regular rate and rhythm Respiratory: Essentially clear to auscultation bilaterally. Not using accessory muscles, speaking in full sentences.  Abdominal: not grossly distended Musculoskeletal: Ambulates w/o diff, FROM * 4 ext.  Vasc: less 2 sec cap RF, warm and pink  Psych:  No HI/SI, judgement and insight good,  Euthymic mood. Full Affect. Hand: Patient with swelling and enlargement of PIP and DIP joints of bilateral hands, with mild deformity of valgus or varus strain.    Recent Results (from the past 2160 hour(s))  POCT Urinalysis Dipstick (Automated)     Status: Normal   Collection Time: 05/03/18  2:52 PM  Result Value Ref Range   Color, UA light yellow    Clarity, UA clear    Glucose, UA Negative Negative   Bilirubin, UA negative    Ketones, UA negative    Spec Grav, UA 1.015 1.010 - 1.025   Blood, UA negative    pH, UA 6.0 5.0 - 8.0   Protein, UA Negative Negative   Urobilinogen, UA 0.2 0.2 or 1.0 E.U./dL   Nitrite, UA negative    Leukocytes, UA Negative Negative  Comp Met (CMET)     Status: Abnormal   Collection Time: 05/03/18  2:53 PM  Result Value Ref Range   Sodium 138 135 - 145 mEq/L   Potassium 4.1 3.5 - 5.1 mEq/L   Chloride 104 96 - 112 mEq/L   CO2 25 19 - 32 mEq/L   Glucose, Bld 93 70 - 99 mg/dL   BUN 24 (H) 6 - 23 mg/dL   Creatinine, Ser 1.17 0.40 - 1.20 mg/dL   Total Bilirubin 0.3 0.2 - 1.2 mg/dL   Alkaline Phosphatase 72 39 - 117 U/L   AST 22 0 - 37 U/L   ALT 20 0 - 35 U/L   Total Protein 7.0 6.0 - 8.3 g/dL   Albumin 4.3 3.5 - 5.2 g/dL   Calcium 9.0 8.4 - 10.5 mg/dL   GFR 47.37 (L) >60.00 mL/min  CBC w/Diff     Status: Abnormal   Collection Time: 05/03/18  2:53 PM  Result Value Ref Range   WBC 5.4 4.0 - 10.5 K/uL   RBC 4.59 3.87 - 5.11 Mil/uL   Hemoglobin 13.5 12.0 - 15.0 g/dL   HCT 40.2 36.0 - 46.0 %   MCV 87.4 78.0 - 100.0 fl   MCHC 33.7 30.0 - 36.0 g/dL   RDW 13.9 11.5 - 15.5 %  Platelets 214.0 150.0 - 400.0 K/uL   Neutrophils Relative % 56.7 43.0 - 77.0 %   Lymphocytes Relative 27.2 12.0 - 46.0 %   Monocytes Relative 12.5 (H) 3.0 - 12.0 %   Eosinophils Relative 3.1 0.0 - 5.0 %   Basophils Relative 0.5 0.0 - 3.0 %   Neutro Abs 3.1 1.4 - 7.7 K/uL   Lymphs Abs 1.5 0.7 - 4.0 K/uL   Monocytes Absolute 0.7 0.1 - 1.0 K/uL   Eosinophils Absolute 0.2 0.0  - 0.7 K/uL   Basophils Absolute 0.0 0.0 - 0.1 K/uL  Urine Culture     Status: None   Collection Time: 05/03/18  3:34 PM  Result Value Ref Range   MICRO NUMBER: [id number]    SPECIMEN QUALITY: Adequate    Sample Source URINE    STATUS: FINAL    Result: No Growth

## 2018-06-26 NOTE — Patient Instructions (Signed)
If you have insomnia or difficulty sleeping, this information is for you:  - Avoid caffeinated beverages after lunch,  no alcoholic beverages,  no eating within 2-3 hours of lying down,  avoid exposure to blue light before bed,  avoid daytime naps, and  needs to maintain a regular sleep schedule- go to sleep and wake up around the same time every night.   - Resolve concerns or worries before entering bedroom:  Discussed relaxation techniques with patient and to keep a journal to write down fears\ worries.  I suggested seeing a counselor for CBT.   - Recommend patient meditate or do deep breathing exercises to help relax.   Incorporate the use of white noise machines or listen to "sleep meditation music", or recordings of guided meditations for sleep from YouTube which are free, such as  "guided meditation for detachment from over thinking"  by Mayford Knife.        Please realize, EXERCISE IS MEDICINE!  -  American Heart Association [facility]) guidelines for exercise : If you are in good health, without any medical conditions, you should engage in 150-300 minutes of moderate intensity aerobic activity per week.  This means you should be huffing and puffing throughout your workout.   Engaging in regular exercise will improve brain function and memory, as well as improve mood, boost immune system and help with weight management.  As well as the other, more well-known effects of exercise such as decreasing blood sugar levels, decreasing blood pressure,  and decreasing bad cholesterol levels/ increasing good cholesterol levels.     -  The AHA strongly endorses consumption of a diet that contains a variety of foods from all the food categories with an emphasis on fruits and vegetables; fat-free and low-fat dairy products; cereal and grain products; legumes and nuts; and fish, poultry, and/or extra lean meats.    Excessive food intake, especially of foods high in saturated and trans fats, sugar, and salt,  should be avoided.    Adequate water intake of roughly 1/2 of your weight in pounds, should equal the ounces of water per day you should drink.  So for instance, if you're 200 pounds, that would be 100 ounces of water per day.         Mediterranean Diet  Why follow it? Research shows. . Those who follow the Mediterranean diet have a reduced risk of heart disease  . The diet is associated with a reduced incidence of Parkinson's and Alzheimer's diseases . People following the diet may have longer life expectancies and lower rates of chronic diseases  . The Dietary Guidelines for Americans recommends the Mediterranean diet as an eating plan to promote health and prevent disease  What Is the Mediterranean Diet?  . Healthy eating plan based on typical foods and recipes of Mediterranean-style cooking . The diet is primarily a plant based diet; these foods should make up a majority of meals   Starches - Plant based foods should make up a majority of meals - They are an important sources of vitamins, minerals, energy, antioxidants, and fiber - Choose whole grains, foods high in fiber and minimally processed items  - Typical grain sources include wheat, oats, barley, corn, brown rice, bulgar, farro, millet, polenta, couscous  - Various types of beans include chickpeas, lentils, fava beans, black beans, white beans   Fruits  Veggies - Large quantities of antioxidant rich fruits & veggies; 6 or more servings  - Vegetables can be eaten raw or  lightly drizzled with oil and cooked  - Vegetables common to the traditional Mediterranean Diet include: artichokes, arugula, beets, broccoli, brussel sprouts, cabbage, carrots, celery, collard greens, cucumbers, eggplant, kale, leeks, lemons, lettuce, mushrooms, okra, onions, peas, peppers, potatoes, pumpkin, radishes, rutabaga, shallots, spinach, sweet potatoes, turnips, zucchini - Fruits common to the Mediterranean Diet include: apples, apricots, avocados,  cherries, clementines, dates, figs, grapefruits, grapes, melons, nectarines, oranges, peaches, pears, pomegranates, strawberries, tangerines  Fats - Replace butter and margarine with healthy oils, such as olive oil, canola oil, and tahini  - Limit nuts to no more than a handful a day  - Nuts include walnuts, almonds, pecans, pistachios, pine nuts  - Limit or avoid candied, honey roasted or heavily salted nuts - Olives are central to the [employer] - can be eaten whole or used in a variety of dishes   Meats Protein - Limiting red meat: no more than a few times a month - When eating red meat: choose lean cuts and keep the portion to the size of deck of cards - Eggs: approx. 0 to 4 times a week  - Fish and lean poultry: at least 2 a week  - Healthy protein sources include, chicken, [place], lean beef, lamb - Increase intake of seafood such as tuna, salmon, trout, mackerel, shrimp, scallops - Avoid or limit high fat processed meats such as sausage and bacon  Dairy - Include moderate amounts of low fat dairy products  - Focus on healthy dairy such as fat free yogurt, skim milk, low or reduced fat cheese - Limit dairy products higher in fat such as whole or 2% milk, cheese, ice cream  Alcohol - Moderate amounts of red wine is ok  - No more than 5 oz daily for women (all ages) and men older than age 17  - No more than 10 oz of wine daily for men younger than 43  Other - Limit sweets and other desserts  - Use herbs and spices instead of salt to flavor foods  - Herbs and spices common to the traditional Mediterranean Diet include: basil, bay leaves, chives, cloves, cumin, fennel, garlic, lavender, marjoram, mint, oregano, parsley, pepper, rosemary, sage, savory, sumac, tarragon, thyme   It's not just a diet, it's a lifestyle:  . The Mediterranean diet includes lifestyle factors typical of those in the region  . Foods, drinks and meals are best eaten with others and savored . Daily physical  activity is important for overall good health . This could be strenuous exercise like running and aerobics . This could also be more leisurely activities such as walking, housework, yard-work, or taking the stairs . Moderation is the key; a balanced and healthy diet accommodates most foods and drinks . Consider portion sizes and frequency of consumption of certain foods   Meal Ideas & Options:  . Breakfast:  o Whole wheat toast or whole wheat English muffins with peanut butter & hard boiled egg o Steel cut oats topped with apples & cinnamon and skim milk  o Fresh fruit: banana, strawberries, melon, berries, peaches  o Smoothies: strawberries, bananas, greek yogurt, peanut butter o Low fat greek yogurt with blueberries and granola  o Egg white omelet with spinach and mushrooms o Breakfast couscous: whole wheat couscous, apricots, skim milk, cranberries  . Sandwiches:  o Hummus and grilled vegetables (peppers, zucchini, squash) on whole wheat bread   o Grilled chicken on whole wheat pita with lettuce, tomatoes, cucumbers or tzatziki  o Tuna salad on whole  wheat bread: tuna salad made with greek yogurt, olives, red peppers, capers, green onions o Garlic rosemary lamb pita: lamb sauted with garlic, rosemary, salt & pepper; add lettuce, cucumber, greek yogurt to pita - flavor with lemon juice and black pepper  . Seafood:  o Mediterranean grilled salmon, seasoned with garlic, basil, parsley, lemon juice and black pepper o Shrimp, lemon, and spinach whole-grain pasta salad made with low fat greek yogurt  o Seared scallops with lemon orzo  o Seared tuna steaks seasoned salt, pepper, coriander topped with tomato mixture of olives, tomatoes, olive oil, minced garlic, parsley, green onions and cappers  . Meats:  o Herbed greek chicken salad with kalamata olives, cucumber, feta  o Red bell peppers stuffed with spinach, bulgur, lean ground beef (or lentils) & topped with feta   o Kebabs: skewers of  chicken, tomatoes, onions, zucchini, squash  o [place] burgers: made with red onions, mint, dill, lemon juice, feta cheese topped with roasted red peppers . Vegetarian o Cucumber salad: cucumbers, artichoke hearts, celery, red onion, feta cheese, tossed in olive oil & lemon juice  o Hummus and whole grain pita points with a greek salad (lettuce, tomato, feta, olives, cucumbers, red onion) o Lentil soup with celery, carrots made with vegetable broth, garlic, salt and pepper  o Tabouli salad: parsley, bulgur, mint, scallions, cucumbers, tomato, radishes, lemon juice, olive oil, salt and pepper.

## 2018-07-23 NOTE — Progress Notes (Signed)
Office Visit Note  Patient: Amanda Cole             Date of Birth: 1940/10/25           MRN: [id number]             PCP: Mellody Dance, DO Referring: Mellody Dance, DO Visit Date: 08/06/2018 Occupation: retired, Optometrist  Subjective:  Pain in both hands and hips.   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  According to patient she has had history of joint pain for many years.  She states the pain has been mostly in her hands and hips.  She was under care of Dr. Charlestine Night for many years until he retired.  She states Dr. Charlestine Night diagnosed her with osteoarthritis.  She took mostly Aleve and then switch to ibuprofen.  She states recently she had increase in creatinine and she stopped taking anti-inflammatories about 4 months ago.  She was recently evaluated by her PCP who felt that her symptoms may be consistent with rheumatoid arthritis.  She states she has discomfort in her bilateral hands and decreased grip strength and lack of use but no swelling.  She describes pain in her bilateral hips which she points at the trochanteric area.  There is positive family history of rheumatoid arthritis in her mother.  Activities of Daily Living:  Patient reports morning stiffness for 24 hours.   Patient Reports nocturnal pain.  Difficulty dressing/grooming: Denies Difficulty climbing stairs: Denies Difficulty getting out of chair: Denies Difficulty using hands for taps, buttons, cutlery, and/or writing: Reports  Review of Systems  Constitutional: Negative for fatigue.  HENT: Negative for mouth dryness.   Eyes: Negative for dryness.  Respiratory: Negative for shortness of breath.   Cardiovascular: Negative for swelling in legs/feet.  Gastrointestinal: Negative for constipation.  Endocrine: Negative for excessive thirst.  Genitourinary: Negative for difficulty urinating.  Musculoskeletal:  Positive for arthralgias, gait problem, joint pain, joint swelling and morning stiffness.  Skin: Positive for rash.  Allergic/Immunologic: Negative for susceptible to infections.  Neurological: Negative for numbness.  Hematological: Negative for bruising/bleeding tendency.  Psychiatric/Behavioral: Positive for sleep disturbance.    PMFS History:  Patient Active Problem List   Diagnosis Date Noted  . Sleep disorder 06/26/2018  . Bilateral hand pain- chronic in DIP and PIP jts 06/26/2018  . Family history of rheumatoid arthritis 06/26/2018  . Dysuria 05/16/2016  . Seborrheic dermatitis 06/22/2015  . Clavicle enlargement 05/22/2014  . Well adult exam 02/29/2012  . Hand pain, left 02/29/2012  . LOW BACK PAIN, ACUTE 03/07/2010  . Vitamin B12 deficiency 02/01/2009  . PALPITATIONS 02/01/2009  . CYSTITIS 05/29/2008  . HIP PAIN 05/29/2008  . HAND PAIN, LEFT 05/29/2008  . HEADACHE 01/10/2008  . RASH-NONVESICULAR 11/01/2007  . HERPES ZOSTER OPHTHALMICUS 10/12/2007  . HEARING IMPAIRMENT 10/03/2007  . ALLERGIC RHINITIS 10/03/2007  . Chronic inflammatory arthritis 10/03/2007  . HYPERLIPIDEMIA 05/14/2007  . SINUSITIS- ACUTE-NOS 05/14/2007    Past Medical History:  Diagnosis Date  . Cataract     Family History  Problem Relation Age of Onset  . Arthritis Mother        RA   Past Surgical History:  Procedure Laterality Date  . CATARACT EXTRACTION     B   Social History   Social History Narrative  . Not on file   Immunization History  Administered Date(s) Administered  .  Pneumococcal Conjugate-13 04/05/2018  . Pneumococcal Polysaccharide-23 03/25/2013  . Td 03/01/2012  . Zoster 03/01/2012     Objective: Vital Signs: BP (!) 122/56 (BP Location: Left Arm, Patient Position: Sitting, Cuff Size: Normal)   Pulse 68   Resp 16   Ht 5\' 4"  (1.626 m)   Wt 127 lb (57.6 kg)   BMI 21.80 kg/m    Physical Exam Vitals signs and nursing note reviewed.  Constitutional:       Appearance: She is well-developed.  HENT:     Head: Normocephalic and atraumatic.  Eyes:     Conjunctiva/sclera: Conjunctivae normal.  Neck:     Musculoskeletal: Normal range of motion.  Cardiovascular:     Rate and Rhythm: Normal rate and regular rhythm.     Heart sounds: Normal heart sounds.  Pulmonary:     Effort: Pulmonary effort is normal.     Breath sounds: Normal breath sounds.  Abdominal:     General: Bowel sounds are normal.     Palpations: Abdomen is soft.  Lymphadenopathy:     Cervical: No cervical adenopathy.  Skin:    General: Skin is warm and dry.     Capillary Refill: Capillary refill takes less than 2 seconds.  Neurological:     Mental Status: She is alert and oriented to person, place, and time.  Psychiatric:        Behavior: Behavior normal.      Musculoskeletal Exam: C-spine thoracic and lumbar spine good range of motion.  Shoulder joints elbow joints wrist joints with good range of motion.  She has DIP and PIP thickening with subluxation of PIPs and DIPs.  No synovitis was noted.  No MCP or wrist joint involvement was noted.  She had painful range of motion of bilateral hip joints.  Knee joints ankles MTPs PIPs been good range of motion with no synovitis.  CDAI Exam: CDAI Score: Not documented Patient Global Assessment: Not documented; Provider Global Assessment: Not documented Swollen: Not documented; Tender: Not documented Joint Exam   Not documented   There is currently no information documented on the homunculus. Go to the Rheumatology activity and complete the homunculus joint exam.  Investigation: Findings:  06/22/15: sed rate 23, RF<10  Component     Latest Ref Rng & Units 06/22/2015  Sed Rate     0 - 22 mm/hr 23 (H)  RA Latex Turbid.     <=14 IU/mL <10   Imaging: Xr Hips Bilat W Or W/o Pelvis 3-4 Views  Result Date: 08/06/2018 Moderate superior lateral narrowing of the right hip joint with inferior spurring and mild superior lateral edge  narrowing of the left hip joint was noted.  No SI joint changes were noted. Impression: Moderate osteoarthritis of the right hip joint and mild osteoarthritis of the left hip joint was noted.  Xr Hand 2 View Left  Result Date: 08/06/2018 Severe narrowing of all PIP and DIP joints with subluxation of most of the PIP DIP joints was noted.  Severe CMC narrowing was noted.  No MCP or intercarpal joint space narrowing was noted. Impression: These findings are consistent with severe inflammatory osteoarthritis.  Xr Hand 2 View Right  Result Date: 08/06/2018 Severe PIP narrowing with subluxation of first, second,  fourth and fifth PIP was noted.  Narrowing and subluxation of all DIP joints with erosive changes in the first and third DIPs was noted.  Ankylosis of fourth DIP was noted.  No MCP or intercarpal changes were noted. Impression: These  findings are consistent with severe inflammatory erosive osteoarthritis.   Recent Labs: Lab Results  Component Value Date   WBC 5.4 05/03/2018   HGB 13.5 05/03/2018   PLT 214.0 05/03/2018   NA 138 05/03/2018   K 4.1 05/03/2018   CL 104 05/03/2018   CO2 25 05/03/2018   GLUCOSE 93 05/03/2018   BUN 24 (H) 05/03/2018   CREATININE 1.17 05/03/2018   BILITOT 0.3 05/03/2018   ALKPHOS 72 05/03/2018   AST 22 05/03/2018   ALT 20 05/03/2018   PROT 7.0 05/03/2018   ALBUMIN 4.3 05/03/2018   CALCIUM 9.0 05/03/2018   GFRAA 57 05/29/2008    Speciality Comments: No specialty comments available.  Procedures:  No procedures performed Allergies: Nitrofurantoin and Tetracycline   Assessment / Plan:     Visit Diagnoses: Pain in both hands - 06/22/15: sed rate 23, RF<10 -patient complains of pain in bilateral hands for many years.  The clinical findings are consistent with severe inflammatory osteoarthritis.  Plan: XR Hand 2 View Right, XR Hand 2 View Left.  The x-ray of bilateral hands showed severe osteoarthritis with subluxation of most of the PIP and DIP joints.  A  handout on hand exercises was given.  As she had problems with oral NSAIDs.  She has diclofenac topical prescription at home.  She will use that..  Also a list of natural anti-inflammatories was given.  Chronic pain of both hips -patient has discomfort range of motion of bilateral hip joints.  Plan: XR HIPS BILAT W OR W/O PELVIS 3-4 VIEWS.  She has moderate osteoarthritis in her right hip joint and mild osteoarthritis in her left hip joint which causes discomfort.  A handout on hip exercises was also given.  Family history of rheumatoid arthritis-in her mother  Seborrheic dermatitis-she uses topical agents.  Herpes zoster ophthalmicus-in the past  History of palpitations  History of hyperlipidemia -patient is currently on no treatment.  Orders: Orders Placed This Encounter  Procedures  . XR HIPS BILAT W OR W/O PELVIS 3-4 VIEWS  . XR Hand 2 View Right  . XR Hand 2 View Left   Meds ordered this encounter  Medications  . DISCONTD: diclofenac sodium (VOLTAREN) 1 % GEL    Sig: 3 grams to 3 large joints up to 3 times daily    Dispense:  3 Tube    Refill:  3    Face-to-face time spent with patient was 50 minutes. Greater than 50% of time was spent in counseling and coordination of care.  Follow-Up Instructions: Return in about 6 months (around 02/06/2019) for Osteoarthritis.   Bo Merino, MD  Note - This record has been created using Editor, commissioning.  Chart creation errors have been sought, but may not always  have been located. Such creation errors do not reflect on  the standard of medical care.

## 2018-08-06 ENCOUNTER — Ambulatory Visit (INDEPENDENT_AMBULATORY_CARE_PROVIDER_SITE_OTHER): Payer: Self-pay

## 2018-08-06 ENCOUNTER — Ambulatory Visit: Payer: Medicare Other | Admitting: Rheumatology

## 2018-08-06 ENCOUNTER — Encounter: Payer: Self-pay | Admitting: Rheumatology

## 2018-08-06 ENCOUNTER — Ambulatory Visit (INDEPENDENT_AMBULATORY_CARE_PROVIDER_SITE_OTHER): Payer: Medicare Other

## 2018-08-06 ENCOUNTER — Other Ambulatory Visit: Payer: Self-pay

## 2018-08-06 VITALS — BP 122/56 | HR 68 | Resp 16 | Ht 64.0 in | Wt 127.0 lb

## 2018-08-06 DIAGNOSIS — Z8261 Family history of arthritis: Secondary | ICD-10-CM | POA: Diagnosis not present

## 2018-08-06 DIAGNOSIS — M25552 Pain in left hip: Secondary | ICD-10-CM | POA: Diagnosis not present

## 2018-08-06 DIAGNOSIS — L219 Seborrheic dermatitis, unspecified: Secondary | ICD-10-CM | POA: Diagnosis not present

## 2018-08-06 DIAGNOSIS — B0230 Zoster ocular disease, unspecified: Secondary | ICD-10-CM | POA: Diagnosis not present

## 2018-08-06 DIAGNOSIS — M79641 Pain in right hand: Secondary | ICD-10-CM

## 2018-08-06 DIAGNOSIS — G8929 Other chronic pain: Secondary | ICD-10-CM

## 2018-08-06 DIAGNOSIS — Z87898 Personal history of other specified conditions: Secondary | ICD-10-CM

## 2018-08-06 DIAGNOSIS — M25551 Pain in right hip: Secondary | ICD-10-CM

## 2018-08-06 DIAGNOSIS — M79642 Pain in left hand: Secondary | ICD-10-CM

## 2018-08-06 DIAGNOSIS — Z8639 Personal history of other endocrine, nutritional and metabolic disease: Secondary | ICD-10-CM

## 2018-08-06 MED ORDER — DICLOFENAC SODIUM 1 % TD GEL: TRANSDERMAL | 3 refills | Status: DC

## 2018-08-06 NOTE — Patient Instructions (Addendum)
Hip Exercises  Ask your health care provider which exercises are safe for you. Do exercises exactly as told by your health care provider and adjust them as directed. It is normal to feel mild stretching, pulling, tightness, or discomfort as you do these exercises, but you should stop right away if you feel sudden pain or your pain gets worse.Do not begin these exercises until told by your health care provider.  Stretching and range of motion exercises  These exercises warm up your muscles and joints and improve the movement and flexibility of your hip. These exercises also help to relieve pain, numbness, and tingling.  Exercise A: Hamstrings, supine    1. Lie on your back.  2. Loop a belt or towel over the ball of your left / rightfoot. The ball of your foot is on the walking surface, right under your toes.  3. Straighten your left / rightknee and slowly pull on the belt to raise your leg.  ? Do not let your left / right knee bend while you do this.  ? Keep your other leg flat on the floor.  ? Raise the left / right leg until you feel a gentle stretch behind your left / right knee or thigh.  4. Hold this position for __________ seconds.  5. Slowly return your leg to the starting position.  Repeat __________ times. Complete this stretch __________ times a day.  Exercise B: Hip rotators    1. Lie on your back on a firm surface.  2. Hold your left / right knee with your left / right hand. Hold your ankle with your other hand.  3. Gently pull your left / right knee and rotate your lower leg toward your other shoulder.  ? Pull until you feel a stretch in your buttocks.  ? Keep your hips and shoulders firmly planted while you do this stretch.  4. Hold this position for __________ seconds.  Repeat __________ times. Complete this stretch __________ times a day.  Exercise C: V-sit (hamstrings and adductors)    1. Sit on the floor with your legs extended in a large "V" shape. Keep your knees straight during this  exercise.  2. Start with your head and chest upright, then bend at your waist to reach for your left foot (position A). You should feel a stretch in your right inner thigh.  3. Hold this position for __________ seconds. Then slowly return to the upright position.  4. Bend at your waist to reach forward (position B). You should feel a stretch behind both of your thighs and knees.  5. Hold this position for __________ seconds. Then slowly return to the upright position.  6. Bend at your waist to reach for your right foot (position C). You should feel a stretch in your left inner thigh.  7. Hold this position for __________ seconds. Then slowly return to the upright position.  Repeat __________ times. Complete this stretch __________ times a day.  Exercise D: Lunge (hip flexors)    1. Place your left / right knee on the floor and bend your other knee so that is directly over your ankle. You should be half-kneeling.  2. Keep good posture with your head over your shoulders.  3. Tighten your buttocks to point your tailbone downward. This helps your back to keep from arching too much.  4. You should feel a gentle stretch in the front of your left / right thigh and hip. If you do not feel any   resistance, slightly slide your other foot forward and then slowly lunge forward so your knee once again lines up over your ankle.  5. Make sure your tailbone continues to point downward.  6. Hold this position for __________ seconds.  Repeat __________ times. Complete this stretch __________ times a day.  Strengthening exercises  These exercises build strength and endurance in your hip. Endurance is the ability to use your muscles for a long time, even after they get tired.  Exercise E: Bridge (hip extensors)    1. Lie on your back on a firm surface with your knees bent and your feet flat on the floor.  2. Tighten your buttocks muscles and lift your bottom off the floor until the trunk of your body is level with your thighs.  ? Do not  arch your back.  ? You should feel the muscles working in your buttocks and the back of your thighs. If you do not feel these muscles, slide your feet 1-2 inches (2.5-5 cm) farther away from your buttocks.  3. Hold this position for __________ seconds.  4. Slowly lower your hips to the starting position.  5. Let your muscles relax completely between repetitions.  6. If this exercise is too easy, try doing it with your arms crossed over your chest.  Repeat __________ times. Complete this exercise __________ times a day.  Exercise F: Straight leg raises - hip abductors    1. Lie on your side with your left / right leg in the top position. Lie so your head, shoulder, knee, and hip line up with each other. You may bend your bottom knee to help you balance.  2. Roll your hips slightly forward, so your hips are stacked directly over each other and your left / right knee is facing forward.  3. Leading with your heel, lift your top leg 4-6 inches (10-15 cm). You should feel the muscles in your outer hip lifting.  ? Do not let your foot drift forward.  ? Do not let your knee roll toward the ceiling.  4. Hold this position for __________ seconds.  5. Slowly return to the starting position.  6. Let your muscles relax completely between repetitions.  Repeat __________ times. Complete this exercise __________ times a day.  Exercise G: Straight leg raises - hip adductors    1. Lie on your side with your left / right leg in the bottom position. Lie so your head, shoulder, knee, and hip line up. You may place your upper foot in front to help you balance.  2. Roll your hips slightly forward, so your hips are stacked directly over each other and your left / right knee is facing forward.  3. Tense the muscles in your inner thigh and lift your bottom leg 4-6 inches (10-15 cm).  4. Hold this position for __________ seconds.  5. Slowly return to the starting position.  6. Let your muscles relax completely between repetitions.  Repeat  __________ times. Complete this exercise __________ times a day.  Exercise H: Straight leg raises - quadriceps    1. Lie on your back with your left / right leg extended and your other knee bent.  2. Tense the muscles in the front of your left / right thigh. When you do this, you should see your kneecap slide up or see increased dimpling just above your knee.  3. Tighten these muscles even more and raise your leg 4-6 inches (10-15 cm) off the floor.  4. Hold   the band or tubing directly opposite of the secured end, step away until there is tension in the tubing or band. Hold onto a chair as needed for balance. 4. Lift your left / right leg out to your side. While you do this: ? Keep your back upright. ? Keep your shoulders over your hips. ? Keep your toes pointing forward. ? Make sure to use your hip muscles to lift your leg. Do not "throw" your leg or tip your body to lift your leg. 5. Hold this position for __________ seconds. 6. Slowly return to the starting position. Repeat __________ times. Complete this exercise __________ times a day. Exercise J: Squats (quadriceps) 1. Stand in a door frame so your feet and knees are in line with the frame. You may place your hands on the frame for balance. 2. Slowly bend your knees and lower your hips like you are going to sit in a chair. ? Keep your lower legs in a straight-up-and-down position. ? Do not let your hips go lower than your knees. ? Do not bend your knees lower than told by your health care provider.  ? If your hip pain increases, do not bend as low. 3. Hold this position for ___________ seconds. 4. Slowly push with your legs to return to standing. Do not use your hands to pull yourself to standing. Repeat __________ times. Complete this exercise __________ times a day. This information is not intended to replace advice given to you by your health care provider. Make sure you discuss any questions you have with your health care provider. Document Released: 05/19/2005 Document Revised: 09/04/2017 Document Reviewed: 04/26/2015 Elsevier Interactive Patient Education  2019 [employer] Hand Exercises Hand exercises can be helpful to almost anyone. These exercises can strengthen the hands, improve flexibility and movement, and increase blood flow to the hands. These results can make work and daily tasks easier. Hand exercises can be especially helpful for people who have joint pain from arthritis or have nerve damage from overuse (carpal tunnel syndrome). These exercises can also help people who have injured a hand. Most of these hand exercises are fairly gentle stretching routines. You can do them often throughout the day. Still, it is a good idea to ask your health care provider which exercises would be best for you. Warming your hands before exercise may help to reduce stiffness. You can do this with gentle massage or by placing your hands in warm water for 15 minutes. Also, make sure you pay attention to your level of hand pain as you begin an exercise routine. Exercises Knuckle bend Repeat this exercise 5-10 times with each hand. 1. Stand or sit with your arm, hand, and all five fingers pointed straight up. Make sure your wrist is straight. 2. Gently and slowly bend your fingers down and inward until the tips of your fingers are touching the tops of your palm. 3. Hold this position for a few seconds. 4. Extend your fingers out to their original position, all pointing straight up again. Finger  fan Repeat this exercise 5-10 times with each hand. 1. Hold your arm and hand out in front of you. Keep your wrist straight. 2. Squeeze your hand into a fist. 3. Hold this position for a few seconds. 4. Edison Simon out, or spread apart, your hand and fingers as much as possible, stretching every joint fully. Tabletop Repeat this exercise 5-10 times with each hand. 1. Stand or sit with your arm, hand, and all five fingers pointed  straight up. Make sure your wrist is straight. 2. Gently and slowly bend your fingers at the knuckles where they meet the hand until your hand is making an upside-down L shape. Your fingers should form a tabletop. 3. Hold this position for a few seconds. 4. Extend your fingers out to their original position, all pointing straight up again. Making Os Repeat this exercise 5-10 times with each hand. 1. Stand or sit with your arm, hand, and all five fingers pointed straight up. Make sure your wrist is straight. 2. Make an O shape by touching your pointer finger to your thumb. Hold for a few seconds. Then open your hand wide. 3. Repeat this motion with each finger on your hand. Table spread Repeat this exercise 5-10 times with each hand. 1. Place your hand on a table with your palm facing down. Make sure your wrist is straight. 2. Spread your fingers out as much as possible. Hold this position for a few seconds. 3. Slide your fingers back together again. Hold for a few seconds. Ball grip Repeat this exercise 10-15 times with each hand. 1. Hold a tennis ball or another soft ball in your hand. 2. While slowly increasing pressure, squeeze the ball as hard as possible. 3. Squeeze as hard as you can for 3-5 seconds. 4. Relax and repeat.  Wrist curls Repeat this exercise 10-15 times with each hand. 1. Sit in a chair that has armrests. 2. Hold a light weight in your hand, such as a dumbbell that weighs 1-3 pounds (0.5-1.4 kg). Ask your health care provider what weight would be  best for you. 3. Rest your hand just over the end of the chair arm with your palm facing up. 4. Gently pivot your wrist up and down while holding the weight. Do not twist your wrist from side to side. Contact a health care provider if:  Your hand pain or discomfort gets much worse when you do an exercise.  Your hand pain or discomfort does not improve within 2 hours after you exercise. If you have any of these problems, stop doing these exercises right away. Do not do them again unless your health care provider says that you can. Get help right away if:  You develop sudden, severe hand pain. If this happens, stop doing these exercises right away. Do not do them again unless your health care provider says that you can. This information is not intended to replace advice given to you by your health care provider. Make sure you discuss any questions you have with your health care provider. Document Released: 04/12/2015 Document Revised: 09/04/2017 Document Reviewed: 11/09/2014 Elsevier Interactive Patient Education  2019 [employer].

## 2018-08-14 ENCOUNTER — Encounter: Payer: Self-pay | Admitting: Family

## 2018-09-03 ENCOUNTER — Ambulatory Visit: Payer: Self-pay | Admitting: Rheumatology

## 2018-12-03 DIAGNOSIS — H35372 Puckering of macula, left eye: Secondary | ICD-10-CM | POA: Diagnosis not present

## 2018-12-03 DIAGNOSIS — H26491 Other secondary cataract, right eye: Secondary | ICD-10-CM | POA: Diagnosis not present

## 2018-12-03 DIAGNOSIS — Z961 Presence of intraocular lens: Secondary | ICD-10-CM | POA: Diagnosis not present

## 2018-12-03 DIAGNOSIS — H353131 Nonexudative age-related macular degeneration, bilateral, early dry stage: Secondary | ICD-10-CM | POA: Diagnosis not present

## 2018-12-23 DIAGNOSIS — H26491 Other secondary cataract, right eye: Secondary | ICD-10-CM | POA: Diagnosis not present

## 2019-01-29 NOTE — Progress Notes (Deleted)
Office Visit Note  Patient: Amanda Cole             Date of Birth: Jun 02, 1940           MRN: [id number]             PCP: Mellody Dance, DO Referring: Mellody Dance, DO Visit Date: 02/06/2019 Occupation: @GUAROCC @  Subjective:  No chief complaint on file.   History of Present Illness: Amanda Cole is a 78 y.o. female ***   Activities of Daily Living:  Patient reports morning stiffness for *** {minute/hour:19697}.   Patient {ACTIONS;DENIES/REPORTS:21021675::"Denies"} nocturnal pain.  Difficulty dressing/grooming: {ACTIONS;DENIES/REPORTS:21021675::"Denies"} Difficulty climbing stairs: {ACTIONS;DENIES/REPORTS:21021675::"Denies"} Difficulty getting out of chair: {ACTIONS;DENIES/REPORTS:21021675::"Denies"} Difficulty using hands for taps, buttons, cutlery, and/or writing: {ACTIONS;DENIES/REPORTS:21021675::"Denies"}  No Rheumatology ROS completed.   PMFS History:  Patient Active Problem List   Diagnosis Date Noted  . Sleep disorder 06/26/2018  . Bilateral hand pain- chronic in DIP and PIP jts 06/26/2018  . Family history of rheumatoid arthritis 06/26/2018  . Dysuria 05/16/2016  . Seborrheic dermatitis 06/22/2015  . Clavicle enlargement 05/22/2014  . Well adult exam 02/29/2012  . Hand pain, left 02/29/2012  . LOW BACK PAIN, ACUTE 03/07/2010  . Vitamin B12 deficiency 02/01/2009  . PALPITATIONS 02/01/2009  . CYSTITIS 05/29/2008  . HIP PAIN 05/29/2008  . HAND PAIN, LEFT 05/29/2008  . HEADACHE 01/10/2008  . RASH-NONVESICULAR 11/01/2007  . HERPES ZOSTER OPHTHALMICUS 10/12/2007  . HEARING IMPAIRMENT 10/03/2007  . ALLERGIC RHINITIS 10/03/2007  . Chronic inflammatory arthritis 10/03/2007  . HYPERLIPIDEMIA 05/14/2007  . SINUSITIS- ACUTE-NOS 05/14/2007    Past Medical History:  Diagnosis Date  . Cataract     Family History  Problem Relation Age of Onset  . Arthritis Mother        RA   Past Surgical History:  Procedure Laterality Date  . CATARACT EXTRACTION      B   Social History   Social History Narrative  . Not on file   Immunization History  Administered Date(s) Administered  . Pneumococcal Conjugate-13 04/05/2018  . Pneumococcal Polysaccharide-23 03/25/2013  . Td 03/01/2012  . Zoster 03/01/2012     Objective: Vital Signs: There were no vitals taken for this visit.   Physical Exam   Musculoskeletal Exam: ***  CDAI Exam: CDAI Score: - Patient Global: -; Provider Global: - Swollen: -; Tender: - Joint Exam   No joint exam has been documented for this visit   There is currently no information documented on the homunculus. Go to the Rheumatology activity and complete the homunculus joint exam.  Investigation: No additional findings.  Imaging: No results found.  Recent Labs: Lab Results  Component Value Date   WBC 5.4 05/03/2018   HGB 13.5 05/03/2018   PLT 214.0 05/03/2018   NA 138 05/03/2018   K 4.1 05/03/2018   CL 104 05/03/2018   CO2 25 05/03/2018   GLUCOSE 93 05/03/2018   BUN 24 (H) 05/03/2018   CREATININE 1.17 05/03/2018   BILITOT 0.3 05/03/2018   ALKPHOS 72 05/03/2018   AST 22 05/03/2018   ALT 20 05/03/2018   PROT 7.0 05/03/2018   ALBUMIN 4.3 05/03/2018   CALCIUM 9.0 05/03/2018   GFRAA 57 05/29/2008  06/22/15: sed rate 23, RF<10  Speciality Comments: No specialty comments available.  Procedures:  No procedures performed Allergies: Nitrofurantoin and Tetracycline   Assessment / Plan:     Visit Diagnoses: No diagnosis found.  Orders: No orders of the defined types were placed in this encounter.  No orders of the defined types were placed in this encounter.   Face-to-face time spent with patient was *** minutes. Greater than 50% of time was spent in counseling and coordination of care.  Follow-Up Instructions: No follow-ups on file.   Bo Merino, MD  Note - This record has been created using Editor, commissioning.  Chart creation errors have been sought, but may not always  have been  located. Such creation errors do not reflect on  the standard of medical care.

## 2019-02-06 ENCOUNTER — Ambulatory Visit: Payer: Self-pay | Admitting: Rheumatology

## 2019-02-19 ENCOUNTER — Telehealth: Payer: Self-pay | Admitting: Internal Medicine

## 2019-02-19 NOTE — Telephone Encounter (Signed)
Copied from [place] [phone]. Topic: General - Other >> Feb 18, 2019  4:54 PM Yvette Rack wrote: Reason for CRM: Pt stated she changed pcp to a [facility] facility near her home but she would like to re-establish with Dr. Alain Marion. Pt requests call back

## 2019-02-19 NOTE — Telephone Encounter (Signed)
Patient states that she was a previous patient of yours and changed to a new PCP that was closer to her home but would like to come back and re-establish with you if possible. Would you be willing to see her to re-establish care?

## 2019-02-21 NOTE — Telephone Encounter (Signed)
Appointment scheduled.

## 2019-02-21 NOTE — Telephone Encounter (Signed)
Ok thx.

## 2019-03-17 ENCOUNTER — Ambulatory Visit (INDEPENDENT_AMBULATORY_CARE_PROVIDER_SITE_OTHER): Payer: Medicare Other | Admitting: Internal Medicine

## 2019-03-17 ENCOUNTER — Other Ambulatory Visit (INDEPENDENT_AMBULATORY_CARE_PROVIDER_SITE_OTHER): Payer: Medicare Other

## 2019-03-17 ENCOUNTER — Encounter: Payer: Self-pay | Admitting: Internal Medicine

## 2019-03-17 ENCOUNTER — Other Ambulatory Visit: Payer: Self-pay

## 2019-03-17 VITALS — BP 122/78 | HR 64 | Temp 97.8°F | Ht 64.0 in | Wt 127.0 lb

## 2019-03-17 DIAGNOSIS — Z0000 Encounter for general adult medical examination without abnormal findings: Secondary | ICD-10-CM

## 2019-03-17 DIAGNOSIS — N183 Chronic kidney disease, stage 3 unspecified: Secondary | ICD-10-CM

## 2019-03-17 DIAGNOSIS — R7989 Other specified abnormal findings of blood chemistry: Secondary | ICD-10-CM

## 2019-03-17 DIAGNOSIS — E538 Deficiency of other specified B group vitamins: Secondary | ICD-10-CM

## 2019-03-17 DIAGNOSIS — M1990 Unspecified osteoarthritis, unspecified site: Secondary | ICD-10-CM

## 2019-03-17 DIAGNOSIS — E559 Vitamin D deficiency, unspecified: Secondary | ICD-10-CM | POA: Diagnosis not present

## 2019-03-17 LAB — BASIC METABOLIC PANEL
BUN: 17 mg/dL (ref 6–23)
CO2: 25 mEq/L (ref 19–32)
Calcium: 9.4 mg/dL (ref 8.4–10.5)
Chloride: 105 mEq/L (ref 96–112)
Creatinine, Ser: 1.01 mg/dL (ref 0.40–1.20)
GFR: 52.69 mL/min — ABNORMAL LOW (ref >60.00)
Glucose, Bld: 92 mg/dL (ref 70–99)
Potassium: 4.3 mEq/L (ref 3.5–5.1)
Sodium: 138 mEq/L (ref 135–145)

## 2019-03-17 LAB — URINALYSIS
Bilirubin Urine: NEGATIVE
Hgb urine dipstick: NEGATIVE
Ketones, ur: NEGATIVE
Leukocytes,Ua: NEGATIVE
Nitrite: NEGATIVE
Specific Gravity, Urine: 1.025 (ref 1.000–1.030)
Total Protein, Urine: NEGATIVE
Urine Glucose: NEGATIVE
Urobilinogen, UA: 0.2 (ref 0.0–1.0)
pH: 5.5 (ref 5.0–8.0)

## 2019-03-17 LAB — CBC WITH DIFFERENTIAL/PLATELET
Basophils Absolute: 0.1 10*3/uL (ref 0.0–0.1)
Basophils Relative: 1.4 % (ref 0.0–3.0)
Eosinophils Absolute: 0.1 10*3/uL (ref 0.0–0.7)
Eosinophils Relative: 1.6 % (ref 0.0–5.0)
HCT: 42.2 % (ref 36.0–46.0)
Hemoglobin: 14.1 g/dL (ref 12.0–15.0)
Lymphocytes Relative: 24.2 % (ref 12.0–46.0)
Lymphs Abs: 1.3 10*3/uL (ref 0.7–4.0)
MCHC: 33.4 g/dL (ref 30.0–36.0)
MCV: 89.4 fl (ref 78.0–100.0)
Monocytes Absolute: 0.7 10*3/uL (ref 0.1–1.0)
Monocytes Relative: 13.2 % — ABNORMAL HIGH (ref 3.0–12.0)
Neutro Abs: 3.3 10*3/uL (ref 1.4–7.7)
Neutrophils Relative %: 59.6 % (ref 43.0–77.0)
Platelets: 200 10*3/uL (ref 150.0–400.0)
RBC: 4.72 Mil/uL (ref 3.87–5.11)
RDW: 14.9 % (ref 11.5–15.5)
WBC: 5.6 10*3/uL (ref 4.0–10.5)

## 2019-03-17 LAB — HEPATIC FUNCTION PANEL
ALT: 58 U/L — ABNORMAL HIGH (ref 0–35)
AST: 46 U/L — ABNORMAL HIGH (ref 0–37)
Albumin: 4.4 g/dL (ref 3.5–5.2)
Alkaline Phosphatase: 79 U/L (ref 39–117)
Bilirubin, Direct: 0.1 mg/dL (ref 0.0–0.3)
Total Bilirubin: 0.5 mg/dL (ref 0.2–1.2)
Total Protein: 7.2 g/dL (ref 6.0–8.3)

## 2019-03-17 LAB — VITAMIN B12: Vitamin B-12: 869 pg/mL (ref 211–911)

## 2019-03-17 LAB — LIPID PANEL
Cholesterol: 294 mg/dL — ABNORMAL HIGH (ref 0–200)
HDL: 57.2 mg/dL (ref >39.00)
LDL Cholesterol: 208 mg/dL — ABNORMAL HIGH (ref 0–99)
NonHDL: 237.22
Total CHOL/HDL Ratio: 5
Triglycerides: 146 mg/dL (ref 0.0–149.0)
VLDL: 29.2 mg/dL (ref 0.0–40.0)

## 2019-03-17 LAB — VITAMIN D 25 HYDROXY (VIT D DEFICIENCY, FRACTURES): VITD: 66.34 ng/mL (ref 30.00–100.00)

## 2019-03-17 LAB — TSH: TSH: 1.58 u[IU]/mL (ref 0.35–4.50)

## 2019-03-17 MED ORDER — HYDROCODONE-ACETAMINOPHEN 5-325 MG PO TABS: 1.0000 | ORAL_TABLET | Freq: Four times a day (QID) | ORAL | 0 refills | Status: DC | PRN

## 2019-03-17 MED ORDER — ACETAMINOPHEN ER 650 MG PO TBCR: 650.0000 mg | EXTENDED_RELEASE_TABLET | Freq: Three times a day (TID) | ORAL | 3 refills | Status: DC | PRN

## 2019-03-17 NOTE — Assessment & Plan Note (Addendum)
Labs  On a MVI Risks associated with treatment noncompliance were discussed. Compliance was encouraged.

## 2019-03-17 NOTE — Assessment & Plan Note (Signed)
Hands Tylenol, Voltaren

## 2019-03-17 NOTE — Progress Notes (Signed)
Subjective:  Patient ID: Amanda Cole, female    DOB: 09-Nov-1940  Age: 78 y.o. MRN: [id number]  CC: No chief complaint on file.   HPI NEVAEN TRAMA presents for CRI, OA Was taking a lot of Ibuprofen in the past - stopped a few months ago Well exam - new pt  Outpatient Medications Prior to Visit  Medication Sig Dispense Refill  . Multiple Vitamins-Minerals (ONE-A-DAY WOMENS 50 PLUS PO) Take by mouth.    . fluticasone (CUTIVATE) 0.05 % cream as needed. Reported on 06/22/2015  3  . fluticasone (FLONASE) 50 MCG/ACT nasal spray Place 1 spray into both nostrils daily. (Patient taking differently: Place 1 spray into both nostrils as needed. ) 48 g 3  . Melatonin 3 MG SUBL Place 3-9 mg under the tongue at bedtime as needed. sleep (Patient not taking: Reported on 08/06/2018)    . METRONIDAZOLE, TOPICAL, 0.75 % LOTN On face bid 59 mL 3   No facility-administered medications prior to visit.     ROS: Review of Systems  Constitutional: Negative for activity change, appetite change, chills, fatigue and unexpected weight change.  HENT: Negative for congestion, mouth sores and sinus pressure.   Eyes: Negative for visual disturbance.  Respiratory: Negative for cough and chest tightness.   Gastrointestinal: Negative for abdominal pain and nausea.  Genitourinary: Negative for difficulty urinating, frequency and vaginal pain.  Musculoskeletal: Positive for arthralgias. Negative for back pain and gait problem.  Skin: Negative for pallor and rash.  Neurological: Negative for dizziness, tremors, weakness, numbness and headaches.  Psychiatric/Behavioral: Negative for confusion, sleep disturbance and suicidal ideas.    Objective:  BP 122/78 (BP Location: Left Arm, Patient Position: Sitting, Cuff Size: Normal)   Pulse 64   Temp 97.8 F (36.6 C) (Oral)   Ht 5\' 4"  (1.626 m)   Wt 127 lb (57.6 kg)   SpO2 97%   BMI 21.80 kg/m   BP Readings from Last 3 Encounters:  03/17/19 122/78  08/06/18 (!)  122/56  06/26/18 129/68    Wt Readings from Last 3 Encounters:  03/17/19 127 lb (57.6 kg)  08/06/18 127 lb (57.6 kg)  06/26/18 130 lb 6.4 oz (59.1 kg)    Physical Exam Constitutional:      General: She is not in acute distress.    Appearance: She is well-developed.  HENT:     Head: Normocephalic.     Right Ear: External ear normal.     Left Ear: External ear normal.     Nose: Nose normal.  Eyes:     General:        Right eye: No discharge.        Left eye: No discharge.     Conjunctiva/sclera: Conjunctivae normal.     Pupils: Pupils are equal, round, and reactive to light.  Neck:     Musculoskeletal: Normal range of motion and neck supple.     Thyroid: No thyromegaly.     Vascular: No JVD.     Trachea: No tracheal deviation.  Cardiovascular:     Rate and Rhythm: Normal rate and regular rhythm.     Heart sounds: Normal heart sounds.  Pulmonary:     Effort: No respiratory distress.     Breath sounds: No stridor. No wheezing.  Abdominal:     General: Bowel sounds are normal. There is no distension.     Palpations: Abdomen is soft. There is no mass.     Tenderness: There is no abdominal tenderness.  There is no guarding or rebound.  Musculoskeletal:        General: Tenderness present.  Lymphadenopathy:     Cervical: No cervical adenopathy.  Skin:    Findings: No erythema or rash.  Neurological:     Mental Status: She is oriented to person, place, and time.     Cranial Nerves: No cranial nerve deficit.     Motor: No abnormal muscle tone.     Coordination: Coordination normal.     Deep Tendon Reflexes: Reflexes normal.  Psychiatric:        Behavior: Behavior normal.        Thought Content: Thought content normal.        Judgment: Judgment normal.    Hands w/OA deformities   Lab Results  Component Value Date   WBC 5.4 05/03/2018   HGB 13.5 05/03/2018   HCT 40.2 05/03/2018   PLT 214.0 05/03/2018   GLUCOSE 93 05/03/2018   CHOL 271 (H) 03/29/2012   TRIG 242.0  (H) 03/29/2012   HDL 58.60 03/29/2012   LDLDIRECT 161.5 03/29/2012   ALT 20 05/03/2018   AST 22 05/03/2018   NA 138 05/03/2018   K 4.1 05/03/2018   CL 104 05/03/2018   CREATININE 1.17 05/03/2018   BUN 24 (H) 05/03/2018   CO2 25 05/03/2018   TSH 1.05 06/22/2015    Dg Clavicle Right  Result Date: 05/22/2014 CLINICAL DATA:  Enlargement along the medial right clavicle x2 weeks, no known injury EXAM: RIGHT CLAVICLE - 2+ VIEWS COMPARISON:  None. FINDINGS: No fracture or dislocation is seen. The joint spaces are preserved. The visualized soft tissues are unremarkable. Visualized right lung is clear. IMPRESSION: No acute osseous abnormality is seen. Electronically Signed   By: Julian Hy M.D.   On: 05/22/2014 14:54    Assessment & Plan:   There are no diagnoses linked to this encounter.   No orders of the defined types were placed in this encounter.    Follow-up: No follow-ups on file.  Walker Kehr, MD

## 2019-03-17 NOTE — Assessment & Plan Note (Signed)
US renal Labs

## 2019-03-18 NOTE — Assessment & Plan Note (Signed)
11/20 mild Liver ultrasound.  Reduce Tylenol intake

## 2019-04-08 ENCOUNTER — Ambulatory Visit
Admission: RE | Admit: 2019-04-08 | Discharge: 2019-04-08 | Disposition: A | Discharge status: Home / Self Care | Payer: Medicare Other | Source: Ambulatory Visit | Attending: Internal Medicine | Admitting: Internal Medicine

## 2019-04-08 DIAGNOSIS — R7989 Other specified abnormal findings of blood chemistry: Secondary | ICD-10-CM | POA: Diagnosis not present

## 2019-04-08 DIAGNOSIS — N183 Chronic kidney disease, stage 3 unspecified: Secondary | ICD-10-CM | POA: Diagnosis not present

## 2019-04-17 ENCOUNTER — Ambulatory Visit: Payer: Medicare Other | Admitting: Internal Medicine

## 2019-04-24 ENCOUNTER — Ambulatory Visit: Payer: Medicare Other | Admitting: Internal Medicine

## 2019-05-26 ENCOUNTER — Encounter: Payer: Self-pay | Admitting: Internal Medicine

## 2019-05-26 ENCOUNTER — Ambulatory Visit (INDEPENDENT_AMBULATORY_CARE_PROVIDER_SITE_OTHER): Payer: Medicare Other | Admitting: Internal Medicine

## 2019-05-26 ENCOUNTER — Other Ambulatory Visit: Payer: Self-pay

## 2019-05-26 VITALS — BP 124/72 | HR 66 | Temp 97.8°F | Ht 64.0 in | Wt 127.0 lb

## 2019-05-26 DIAGNOSIS — E538 Deficiency of other specified B group vitamins: Secondary | ICD-10-CM | POA: Diagnosis not present

## 2019-05-26 DIAGNOSIS — R748 Abnormal levels of other serum enzymes: Secondary | ICD-10-CM

## 2019-05-26 DIAGNOSIS — M79641 Pain in right hand: Secondary | ICD-10-CM

## 2019-05-26 DIAGNOSIS — E785 Hyperlipidemia, unspecified: Secondary | ICD-10-CM | POA: Diagnosis not present

## 2019-05-26 DIAGNOSIS — M79642 Pain in left hand: Secondary | ICD-10-CM

## 2019-05-26 DIAGNOSIS — M25559 Pain in unspecified hip: Secondary | ICD-10-CM | POA: Diagnosis not present

## 2019-05-26 LAB — BASIC METABOLIC PANEL
BUN: 19 mg/dL (ref 6–23)
CO2: 26 mEq/L (ref 19–32)
Calcium: 9.2 mg/dL (ref 8.4–10.5)
Chloride: 105 mEq/L (ref 96–112)
Creatinine, Ser: 1.15 mg/dL (ref 0.40–1.20)
GFR: 45.34 mL/min — ABNORMAL LOW (ref >60.00)
Glucose, Bld: 87 mg/dL (ref 70–99)
Potassium: 4.2 mEq/L (ref 3.5–5.1)
Sodium: 138 mEq/L (ref 135–145)

## 2019-05-26 LAB — HEPATIC FUNCTION PANEL
ALT: 103 U/L — ABNORMAL HIGH (ref 0–35)
AST: 75 U/L — ABNORMAL HIGH (ref 0–37)
Albumin: 4.1 g/dL (ref 3.5–5.2)
Alkaline Phosphatase: 81 U/L (ref 39–117)
Bilirubin, Direct: 0.1 mg/dL (ref 0.0–0.3)
Total Bilirubin: 0.6 mg/dL (ref 0.2–1.2)
Total Protein: 7.4 g/dL (ref 6.0–8.3)

## 2019-05-26 MED ORDER — HYDROCODONE-ACETAMINOPHEN 5-325 MG PO TABS: 1.0000 | ORAL_TABLET | Freq: Four times a day (QID) | ORAL | 0 refills | Status: DC | PRN

## 2019-05-26 NOTE — Assessment & Plan Note (Addendum)
Discussed  CT ca score test offered  Statins discussed

## 2019-05-26 NOTE — Assessment & Plan Note (Addendum)
Norco - rare use  Potential benefits of a long term opioids use as well as potential risks (i.e. addiction risk, apnea etc) and complications (i.e. Somnolence, constipation and others) were explained to the patient and were aknowledged.

## 2019-05-26 NOTE — Assessment & Plan Note (Signed)
On a MVI °

## 2019-05-26 NOTE — Patient Instructions (Signed)

## 2019-05-26 NOTE — Progress Notes (Signed)
Subjective:  Patient ID: Amanda Cole, female    DOB: 1941-05-09  Age: 79 y.o. MRN: [id number]  CC: No chief complaint on file.   HPI Amanda Cole presents for hands OA. Elevated LFTs, CRI   Outpatient Medications Prior to Visit  Medication Sig Dispense Refill  . acetaminophen (TYLENOL 8 HOUR) 650 MG CR tablet Take 1 tablet (650 mg total) by mouth every 8 (eight) hours as needed for pain. 100 tablet 3  . aspirin EC 81 MG tablet Take 81 mg by mouth daily.    Marland Kitchen BIOTIN PO Take by mouth.    Marland Kitchen HYDROcodone-acetaminophen (NORCO/VICODIN) 5-325 MG tablet Take 1 tablet by mouth every 6 (six) hours as needed for severe pain. 20 tablet 0  . Multiple Vitamins-Minerals (ONE-A-DAY WOMENS 50 PLUS PO) Take by mouth.     No facility-administered medications prior to visit.    ROS: Review of Systems  Constitutional: Negative for activity change, appetite change, chills, fatigue and unexpected weight change.  HENT: Negative for congestion, mouth sores and sinus pressure.   Eyes: Negative for visual disturbance.  Respiratory: Negative for cough and chest tightness.   Gastrointestinal: Negative for abdominal pain and nausea.  Genitourinary: Negative for difficulty urinating, frequency and vaginal pain.  Musculoskeletal: Positive for arthralgias, back pain and gait problem.  Skin: Negative for pallor and rash.  Neurological: Negative for dizziness, tremors, weakness, numbness and headaches.  Psychiatric/Behavioral: Negative for confusion, sleep disturbance and suicidal ideas.    Objective:  BP 124/72 (BP Location: Left Arm, Patient Position: Sitting, Cuff Size: Normal)   Pulse 66   Temp 97.8 F (36.6 C) (Oral)   Ht 5\' 4"  (1.626 m)   Wt 127 lb (57.6 kg)   SpO2 98%   BMI 21.80 kg/m   BP Readings from Last 3 Encounters:  05/26/19 124/72  03/17/19 122/78  08/06/18 (!) 122/56    Wt Readings from Last 3 Encounters:  05/26/19 127 lb (57.6 kg)  03/17/19 127 lb (57.6 kg)  08/06/18 127  lb (57.6 kg)    Physical Exam Constitutional:      General: She is not in acute distress.    Appearance: She is well-developed.  HENT:     Head: Normocephalic.     Right Ear: External ear normal.     Left Ear: External ear normal.     Nose: Nose normal.  Eyes:     General:        Right eye: No discharge.        Left eye: No discharge.     Conjunctiva/sclera: Conjunctivae normal.     Pupils: Pupils are equal, round, and reactive to light.  Neck:     Thyroid: No thyromegaly.     Vascular: No JVD.     Trachea: No tracheal deviation.  Cardiovascular:     Rate and Rhythm: Normal rate and regular rhythm.     Heart sounds: Normal heart sounds.  Pulmonary:     Effort: No respiratory distress.     Breath sounds: No stridor. No wheezing.  Abdominal:     General: Bowel sounds are normal. There is no distension.     Palpations: Abdomen is soft. There is no mass.     Tenderness: There is no abdominal tenderness. There is no guarding or rebound.  Musculoskeletal:        General: No tenderness.     Cervical back: Normal range of motion and neck supple.  Lymphadenopathy:     Cervical:  No cervical adenopathy.  Skin:    Findings: No erythema or rash.  Neurological:     Cranial Nerves: No cranial nerve deficit.     Motor: No abnormal muscle tone.     Coordination: Coordination normal.     Deep Tendon Reflexes: Reflexes normal.  Psychiatric:        Behavior: Behavior normal.        Thought Content: Thought content normal.        Judgment: Judgment normal.   hands w/OA deformities  Lab Results  Component Value Date   WBC 5.6 03/17/2019   HGB 14.1 03/17/2019   HCT 42.2 03/17/2019   PLT 200.0 03/17/2019   GLUCOSE 92 03/17/2019   CHOL 294 (H) 03/17/2019   TRIG 146.0 03/17/2019   HDL 57.20 03/17/2019   LDLDIRECT 161.5 03/29/2012   LDLCALC 208 (H) 03/17/2019   ALT 58 (H) 03/17/2019   AST 46 (H) 03/17/2019   NA 138 03/17/2019   K 4.3 03/17/2019   CL 105 03/17/2019    CREATININE 1.01 03/17/2019   BUN 17 03/17/2019   CO2 25 03/17/2019   TSH 1.58 03/17/2019    US Abdomen Complete  Result Date: 04/08/2019 CLINICAL DATA:  Chronic renal insufficiency, stage 3. Elevated liver function test. CRI and elevated LFTs. EXAM: ABDOMEN ULTRASOUND COMPLETE COMPARISON:  No pertinent prior studies available for comparison. FINDINGS: Gallbladder: No gallstones or wall thickening visualized. No sonographic Murphy sign noted by sonographer. Common bile duct: Diameter: 3 mm Liver: No focal lesion identified. Within normal limits in parenchymal echogenicity. Interrogated port vein is patent on color Doppler imaging with normal direction of blood flow towards the liver. IVC: No abnormality visualized. Pancreas: Visualized portion unremarkable. Spleen: Size and appearance within normal limits. Right Kidney: Length: 9.8 cm. Echogenicity within normal limits. No mass or hydronephrosis visualized. 1.1 x 0.7 x 0.8 cm simple appearing cyst within the lower pole. Left Kidney: Length: 8.3 cm. Echogenicity within normal limits. No mass or hydronephrosis visualized. 0.9 x 0.9 x 0.7 cm simple appearing cyst within the lower pole. Abdominal aorta: No aneurysm visualized. IMPRESSION: Nonspecific renal size discrepancy with the right kidney measuring 9.8 cm in length, and the left kidney measuring 8.3 cm in length. No hydronephrosis. Small bilateral simple appearing renal cysts. Otherwise unremarkable abdominal ultrasound, as detailed. Electronically Signed   By: Kellie Simmering DO   On: 04/08/2019 14:15    Assessment & Plan:   There are no diagnoses linked to this encounter.   No orders of the defined types were placed in this encounter.    Follow-up: No follow-ups on file.  Walker Kehr, MD

## 2019-05-26 NOTE — Assessment & Plan Note (Signed)
Norco prn - low dose  Potential benefits of a long term opioids use as well as potential risks (i.e. addiction risk, apnea etc) and complications (i.e. Somnolence, constipation and others) were explained to the patient and were aknowledged.

## 2019-05-28 ENCOUNTER — Other Ambulatory Visit: Payer: Self-pay | Admitting: Internal Medicine

## 2019-05-28 DIAGNOSIS — R7989 Other specified abnormal findings of blood chemistry: Secondary | ICD-10-CM

## 2019-05-28 DIAGNOSIS — M1990 Unspecified osteoarthritis, unspecified site: Secondary | ICD-10-CM

## 2019-05-28 DIAGNOSIS — R748 Abnormal levels of other serum enzymes: Secondary | ICD-10-CM

## 2019-06-13 ENCOUNTER — Ambulatory Visit: Payer: Medicare Other

## 2019-06-16 DIAGNOSIS — L820 Inflamed seborrheic keratosis: Secondary | ICD-10-CM | POA: Diagnosis not present

## 2019-06-21 ENCOUNTER — Ambulatory Visit: Payer: Medicare Other | Attending: Internal Medicine

## 2019-06-21 DIAGNOSIS — Z23 Encounter for immunization: Secondary | ICD-10-CM

## 2019-06-21 NOTE — Progress Notes (Signed)
   Covid-19 Vaccination Clinic  Name:  Amanda Cole    MRN: [id number] DOB: 1941-01-14  06/21/2019  Amanda Cole was observed post Covid-19 immunization for 15 minutes without incidence. She was provided with Vaccine Information Sheet and instruction to access the V-Safe system.   Amanda Cole was instructed to call 911 with any severe reactions post vaccine: Marland Kitchen Difficulty breathing  . Swelling of your face and throat  . A fast heartbeat  . A bad rash all over your body  . Dizziness and weakness    Immunizations Administered    Name Date Dose VIS Date Route   Pfizer COVID-19 Vaccine 06/21/2019 12:20 PM 0.3 mL 04/25/2019 Intramuscular   Manufacturer: [place]   Lot: [id number]   [place]: [id number]

## 2019-06-27 ENCOUNTER — Other Ambulatory Visit (INDEPENDENT_AMBULATORY_CARE_PROVIDER_SITE_OTHER): Payer: Medicare Other

## 2019-06-27 DIAGNOSIS — R7989 Other specified abnormal findings of blood chemistry: Secondary | ICD-10-CM | POA: Diagnosis not present

## 2019-06-27 DIAGNOSIS — M1990 Unspecified osteoarthritis, unspecified site: Secondary | ICD-10-CM | POA: Diagnosis not present

## 2019-06-27 LAB — BASIC METABOLIC PANEL
BUN: 15 mg/dL (ref 6–23)
CO2: 26 mEq/L (ref 19–32)
Calcium: 9.1 mg/dL (ref 8.4–10.5)
Chloride: 101 mEq/L (ref 96–112)
Creatinine, Ser: 1.05 mg/dL (ref 0.40–1.20)
GFR: 50.35 mL/min — ABNORMAL LOW (ref >60.00)
Glucose, Bld: 86 mg/dL (ref 70–99)
Potassium: 4.2 mEq/L (ref 3.5–5.1)
Sodium: 134 mEq/L — ABNORMAL LOW (ref 135–145)

## 2019-06-27 LAB — HEPATIC FUNCTION PANEL
ALT: 89 U/L — ABNORMAL HIGH (ref 0–35)
AST: 63 U/L — ABNORMAL HIGH (ref 0–37)
Albumin: 4.1 g/dL (ref 3.5–5.2)
Alkaline Phosphatase: 81 U/L (ref 39–117)
Bilirubin, Direct: 0.1 mg/dL (ref 0.0–0.3)
Total Bilirubin: 0.5 mg/dL (ref 0.2–1.2)
Total Protein: 7.4 g/dL (ref 6.0–8.3)

## 2019-06-30 ENCOUNTER — Other Ambulatory Visit: Payer: Self-pay | Admitting: Internal Medicine

## 2019-06-30 ENCOUNTER — Ambulatory Visit: Payer: Medicare Other

## 2019-06-30 DIAGNOSIS — R748 Abnormal levels of other serum enzymes: Secondary | ICD-10-CM

## 2019-06-30 DIAGNOSIS — M1990 Unspecified osteoarthritis, unspecified site: Secondary | ICD-10-CM

## 2019-06-30 LAB — ANA: Anti Nuclear Antibody (ANA): POSITIVE — AB

## 2019-06-30 LAB — IRON,TIBC AND FERRITIN PANEL
%SAT: 29 % (calc) (ref 16–45)
Ferritin: 128 ng/mL (ref 16–288)
Iron: 87 ug/dL (ref 45–160)
TIBC: 305 mcg/dL (calc) (ref 250–450)

## 2019-06-30 LAB — HEPATITIS C ANTIBODY
Hepatitis C Ab: NONREACTIVE
SIGNAL TO CUT-OFF: 0.01 (ref <1.00)

## 2019-06-30 LAB — ANTI-NUCLEAR AB-TITER (ANA TITER): ANA Titer 1: 1:80 {titer} — ABNORMAL HIGH

## 2019-06-30 LAB — RHEUMATOID FACTOR: Rheumatoid fact SerPl-aCnc: <14 IU/mL (ref <14)

## 2019-07-01 ENCOUNTER — Other Ambulatory Visit: Payer: Self-pay | Admitting: Internal Medicine

## 2019-07-01 DIAGNOSIS — M79641 Pain in right hand: Secondary | ICD-10-CM

## 2019-07-11 ENCOUNTER — Ambulatory Visit: Payer: Medicare Other

## 2019-07-16 ENCOUNTER — Ambulatory Visit: Payer: Medicare Other | Attending: Internal Medicine

## 2019-07-16 DIAGNOSIS — Z23 Encounter for immunization: Secondary | ICD-10-CM

## 2019-07-16 NOTE — Progress Notes (Signed)
   Covid-19 Vaccination Clinic  Name:  Amanda Cole    MRN: [id number] DOB: 01/14/1941  07/16/2019  [name] was observed post Covid-19 immunization for 15 minutes without incident. She was provided with Vaccine Information Sheet and instruction to access the V-Safe system.   [name] was instructed to call 911 with any severe reactions post vaccine: Marland Kitchen Difficulty breathing  . Swelling of face and throat  . A fast heartbeat  . A bad rash all over body  . Dizziness and weakness   Immunizations Administered    Name Date Dose VIS Date Route   Pfizer COVID-19 Vaccine 07/16/2019  8:13 AM 0.3 mL 04/25/2019 Intramuscular   Manufacturer: [place]   Lot: [id number]   [place]: [id number]

## 2019-08-01 DIAGNOSIS — R7989 Other specified abnormal findings of blood chemistry: Secondary | ICD-10-CM | POA: Diagnosis not present

## 2019-08-01 DIAGNOSIS — Z6822 Body mass index (BMI) 22.0-22.9, adult: Secondary | ICD-10-CM | POA: Diagnosis not present

## 2019-08-01 DIAGNOSIS — M150 Primary generalized (osteo)arthritis: Secondary | ICD-10-CM | POA: Diagnosis not present

## 2019-08-01 DIAGNOSIS — M25551 Pain in right hip: Secondary | ICD-10-CM | POA: Diagnosis not present

## 2019-08-01 DIAGNOSIS — R768 Other specified abnormal immunological findings in serum: Secondary | ICD-10-CM | POA: Diagnosis not present

## 2019-08-20 DIAGNOSIS — M1990 Unspecified osteoarthritis, unspecified site: Secondary | ICD-10-CM

## 2019-08-20 DIAGNOSIS — R748 Abnormal levels of other serum enzymes: Secondary | ICD-10-CM

## 2019-08-21 ENCOUNTER — Telehealth: Payer: Self-pay | Admitting: Internal Medicine

## 2019-08-21 NOTE — Telephone Encounter (Signed)
New message:   Pt is calling and states she is needing lab orders put in so she can go tomorrow so her results will be back before her appt on Monday

## 2019-08-22 NOTE — Telephone Encounter (Signed)
° ° °  Should patient have labs prior to 4/12 appointment

## 2019-08-22 NOTE — Telephone Encounter (Signed)
See my chart message

## 2019-08-25 ENCOUNTER — Ambulatory Visit: Payer: Medicare Other | Admitting: Internal Medicine

## 2019-08-25 ENCOUNTER — Other Ambulatory Visit (INDEPENDENT_AMBULATORY_CARE_PROVIDER_SITE_OTHER): Payer: Medicare Other

## 2019-08-25 DIAGNOSIS — M1990 Unspecified osteoarthritis, unspecified site: Secondary | ICD-10-CM

## 2019-08-25 DIAGNOSIS — R748 Abnormal levels of other serum enzymes: Secondary | ICD-10-CM | POA: Diagnosis not present

## 2019-08-25 LAB — BASIC METABOLIC PANEL
BUN: 16 mg/dL (ref 6–23)
CO2: 22 mEq/L (ref 19–32)
Calcium: 9.2 mg/dL (ref 8.4–10.5)
Chloride: 103 mEq/L (ref 96–112)
Creatinine, Ser: 1.03 mg/dL (ref 0.40–1.20)
GFR: 51.46 mL/min — ABNORMAL LOW (ref >60.00)
Glucose, Bld: 90 mg/dL (ref 70–99)
Potassium: 4.2 mEq/L (ref 3.5–5.1)
Sodium: 134 mEq/L — ABNORMAL LOW (ref 135–145)

## 2019-08-25 LAB — HEPATIC FUNCTION PANEL
ALT: 97 U/L — ABNORMAL HIGH (ref 0–35)
AST: 70 U/L — ABNORMAL HIGH (ref 0–37)
Albumin: 4.2 g/dL (ref 3.5–5.2)
Alkaline Phosphatase: 84 U/L (ref 39–117)
Bilirubin, Direct: 0.1 mg/dL (ref 0.0–0.3)
Total Bilirubin: 0.7 mg/dL (ref 0.2–1.2)
Total Protein: 7.4 g/dL (ref 6.0–8.3)

## 2019-08-26 LAB — RHEUMATOID FACTOR: Rheumatoid fact SerPl-aCnc: <14 IU/mL (ref <14)

## 2019-08-27 ENCOUNTER — Encounter: Payer: Self-pay | Admitting: Internal Medicine

## 2019-08-27 ENCOUNTER — Other Ambulatory Visit: Payer: Self-pay

## 2019-08-27 ENCOUNTER — Ambulatory Visit (INDEPENDENT_AMBULATORY_CARE_PROVIDER_SITE_OTHER): Payer: Medicare Other | Admitting: Internal Medicine

## 2019-08-27 DIAGNOSIS — N183 Chronic kidney disease, stage 3 unspecified: Secondary | ICD-10-CM

## 2019-08-27 DIAGNOSIS — E538 Deficiency of other specified B group vitamins: Secondary | ICD-10-CM | POA: Diagnosis not present

## 2019-08-27 DIAGNOSIS — R748 Abnormal levels of other serum enzymes: Secondary | ICD-10-CM | POA: Insufficient documentation

## 2019-08-27 MED ORDER — OXYCODONE HCL 5 MG PO TABS: 2.5000 mg | ORAL_TABLET | Freq: Four times a day (QID) | ORAL | 0 refills | Status: DC | PRN

## 2019-08-27 NOTE — Assessment & Plan Note (Signed)
MVI

## 2019-08-27 NOTE — Progress Notes (Signed)
Subjective:  Patient ID: Amanda Cole, female    DOB: 10-12-1940  Age: 79 y.o. MRN: [id number]  CC: No chief complaint on file.   HPI Amanda Cole presents for CRI, elevated LFTs, arthritis pain. Not taking any meds now due to a fear of addiction and organ damage  Outpatient Medications Prior to Visit  Medication Sig Dispense Refill  . aspirin EC 81 MG tablet Take 81 mg by mouth daily.    Marland Kitchen BIOTIN PO Take by mouth.    Marland Kitchen HYDROcodone-acetaminophen (NORCO/VICODIN) 5-325 MG tablet Take 1 tablet by mouth every 6 (six) hours as needed for severe pain. (Patient not taking: Reported on 08/27/2019) 60 tablet 0  . Multiple Vitamins-Minerals (ONE-A-DAY WOMENS 50 PLUS PO) Take by mouth.     No facility-administered medications prior to visit.    ROS: Review of Systems  Constitutional: Negative for activity change, appetite change, chills, fatigue and unexpected weight change.  HENT: Negative for congestion, mouth sores and sinus pressure.   Eyes: Negative for visual disturbance.  Respiratory: Negative for cough and chest tightness.   Gastrointestinal: Negative for abdominal pain and nausea.  Genitourinary: Negative for difficulty urinating, frequency and vaginal pain.  Musculoskeletal: Positive for arthralgias. Negative for back pain and gait problem.  Skin: Negative for pallor and rash.  Neurological: Negative for dizziness, tremors, weakness, numbness and headaches.  Psychiatric/Behavioral: Negative for confusion and sleep disturbance.    Objective:  BP 128/80 (BP Location: Left Arm, Patient Position: Sitting, Cuff Size: Normal)   Pulse 67   Temp 98 F (36.7 C) (Oral)   Ht 5\' 4"  (1.626 m)   Wt 128 lb (58.1 kg)   SpO2 96%   BMI 21.97 kg/m   BP Readings from Last 3 Encounters:  08/27/19 128/80  05/26/19 124/72  03/17/19 122/78    Wt Readings from Last 3 Encounters:  08/27/19 128 lb (58.1 kg)  05/26/19 127 lb (57.6 kg)  03/17/19 127 lb (57.6 kg)    Physical  Exam Constitutional:      General: She is not in acute distress.    Appearance: She is well-developed.  HENT:     Head: Normocephalic.     Right Ear: External ear normal.     Left Ear: External ear normal.     Nose: Nose normal.  Eyes:     General:        Right eye: No discharge.        Left eye: No discharge.     Conjunctiva/sclera: Conjunctivae normal.     Pupils: Pupils are equal, round, and reactive to light.  Neck:     Thyroid: No thyromegaly.     Vascular: No JVD.     Trachea: No tracheal deviation.  Cardiovascular:     Rate and Rhythm: Normal rate and regular rhythm.     Heart sounds: Normal heart sounds.  Pulmonary:     Effort: No respiratory distress.     Breath sounds: No stridor. No wheezing.  Abdominal:     General: Bowel sounds are normal. There is no distension.     Palpations: Abdomen is soft. There is no mass.     Tenderness: There is no abdominal tenderness. There is no guarding or rebound.  Musculoskeletal:        General: Tenderness present.     Cervical back: Normal range of motion and neck supple.  Lymphadenopathy:     Cervical: No cervical adenopathy.  Skin:    Findings: No erythema  or rash.  Neurological:     Cranial Nerves: No cranial nerve deficit.     Motor: No abnormal muscle tone.     Coordination: Coordination normal.     Deep Tendon Reflexes: Reflexes normal.  Psychiatric:        Behavior: Behavior normal.        Thought Content: Thought content normal.        Judgment: Judgment normal.   no HSM Painful joints   Lab Results  Component Value Date   WBC 5.6 03/17/2019   HGB 14.1 03/17/2019   HCT 42.2 03/17/2019   PLT 200.0 03/17/2019   GLUCOSE 90 08/25/2019   CHOL 294 (H) 03/17/2019   TRIG 146.0 03/17/2019   HDL 57.20 03/17/2019   LDLDIRECT 161.5 03/29/2012   LDLCALC 208 (H) 03/17/2019   ALT 97 (H) 08/25/2019   AST 70 (H) 08/25/2019   NA 134 (L) 08/25/2019   K 4.2 08/25/2019   CL 103 08/25/2019   CREATININE 1.03 08/25/2019    BUN 16 08/25/2019   CO2 22 08/25/2019   TSH 1.58 03/17/2019    US Abdomen Complete  Result Date: 04/08/2019 CLINICAL DATA:  Chronic renal insufficiency, stage 3. Elevated liver function test. CRI and elevated LFTs. EXAM: ABDOMEN ULTRASOUND COMPLETE COMPARISON:  No pertinent prior studies available for comparison. FINDINGS: Gallbladder: No gallstones or wall thickening visualized. No sonographic Murphy sign noted by sonographer. Common bile duct: Diameter: 3 mm Liver: No focal lesion identified. Within normal limits in parenchymal echogenicity. Interrogated port vein is patent on color Doppler imaging with normal direction of blood flow towards the liver. IVC: No abnormality visualized. Pancreas: Visualized portion unremarkable. Spleen: Size and appearance within normal limits. Right Kidney: Length: 9.8 cm. Echogenicity within normal limits. No mass or hydronephrosis visualized. 1.1 x 0.7 x 0.8 cm simple appearing cyst within the lower pole. Left Kidney: Length: 8.3 cm. Echogenicity within normal limits. No mass or hydronephrosis visualized. 0.9 x 0.9 x 0.7 cm simple appearing cyst within the lower pole. Abdominal aorta: No aneurysm visualized. IMPRESSION: Nonspecific renal size discrepancy with the right kidney measuring 9.8 cm in length, and the left kidney measuring 8.3 cm in length. No hydronephrosis. Small bilateral simple appearing renal cysts. Otherwise unremarkable abdominal ultrasound, as detailed. Electronically Signed   By: Kellie Simmering DO   On: 04/08/2019 14:15    Assessment & Plan:   There are no diagnoses linked to this encounter.   No orders of the defined types were placed in this encounter.    Follow-up: No follow-ups on file.  Walker Kehr, MD

## 2019-08-27 NOTE — Assessment & Plan Note (Signed)
Stable

## 2019-08-27 NOTE — Assessment & Plan Note (Addendum)
Chronic ?autoimmune vs other GI consult Dr Earlean Shawl No Tylenol

## 2019-08-31 LAB — ANA TITER AND PATTERN: Homogeneous Pattern: 1:80 {titer} — ABNORMAL HIGH

## 2019-08-31 LAB — ANA W/REFLEX: ANA Titer 1: NEGATIVE

## 2019-08-31 LAB — ANTI-NUCLEAR AB BY IFA (RDL): Anti-Nuclear Ab by IFA (RDL): POSITIVE — AB

## 2019-10-31 ENCOUNTER — Encounter: Payer: Self-pay | Admitting: Internal Medicine

## 2019-11-11 ENCOUNTER — Other Ambulatory Visit: Payer: Self-pay | Admitting: Internal Medicine

## 2019-11-11 DIAGNOSIS — R748 Abnormal levels of other serum enzymes: Secondary | ICD-10-CM | POA: Diagnosis not present

## 2019-11-21 ENCOUNTER — Other Ambulatory Visit: Payer: Medicare Other

## 2019-11-25 ENCOUNTER — Encounter: Payer: Self-pay | Admitting: Internal Medicine

## 2019-11-25 ENCOUNTER — Ambulatory Visit (INDEPENDENT_AMBULATORY_CARE_PROVIDER_SITE_OTHER): Payer: Medicare Other | Admitting: Internal Medicine

## 2019-11-25 ENCOUNTER — Other Ambulatory Visit: Payer: Self-pay

## 2019-11-25 DIAGNOSIS — E538 Deficiency of other specified B group vitamins: Secondary | ICD-10-CM | POA: Diagnosis not present

## 2019-11-25 DIAGNOSIS — R748 Abnormal levels of other serum enzymes: Secondary | ICD-10-CM

## 2019-11-25 DIAGNOSIS — M1990 Unspecified osteoarthritis, unspecified site: Secondary | ICD-10-CM

## 2019-11-25 DIAGNOSIS — N183 Chronic kidney disease, stage 3 unspecified: Secondary | ICD-10-CM | POA: Diagnosis not present

## 2019-11-25 MED ORDER — B COMPLEX PO TABS: 1.0000 | ORAL_TABLET | Freq: Every day | ORAL | 3 refills | Status: DC

## 2019-11-25 MED ORDER — VITAMIN D3 50 MCG (2000 UT) PO CAPS: 2000.0000 [IU] | ORAL_CAPSULE | Freq: Every day | ORAL | 3 refills | Status: DC

## 2019-11-25 NOTE — Assessment & Plan Note (Signed)
No change Hydrate well

## 2019-11-25 NOTE — Assessment & Plan Note (Signed)
Stable Labs in 3 mo, [place]

## 2019-11-25 NOTE — Assessment & Plan Note (Addendum)
Start B complex °

## 2019-11-25 NOTE — Progress Notes (Signed)
Subjective:  Patient ID: Amanda Cole, female    DOB: 14-Oct-1940  Age: 79 y.o. MRN: [id number]  CC: No chief complaint on file.   HPI Amanda Cole presents for elevated LFTs, B12 def, OA Pt saw Dr Earlean Shawl - ANA was up to 1:640  Outpatient Medications Prior to Visit  Medication Sig Dispense Refill  . aspirin EC 81 MG tablet Take 81 mg by mouth daily. (Patient not taking: Reported on 11/25/2019)    . BIOTIN PO Take by mouth. (Patient not taking: Reported on 11/25/2019)    . Multiple Vitamins-Minerals (ONE-A-DAY WOMENS 50 PLUS PO) Take by mouth. (Patient not taking: Reported on 11/25/2019)    . oxyCODONE (ROXICODONE) 5 MG immediate release tablet Take 0.5-1 tablets (2.5-5 mg total) by mouth every 6 (six) hours as needed for severe pain. (Patient not taking: Reported on 11/25/2019) 20 tablet 0   No facility-administered medications prior to visit.    ROS: Review of Systems  Constitutional: Negative for activity change, appetite change, chills, fatigue and unexpected weight change.  HENT: Negative for congestion, mouth sores and sinus pressure.   Eyes: Negative for visual disturbance.  Respiratory: Negative for cough and chest tightness.   Gastrointestinal: Negative for abdominal pain and nausea.  Genitourinary: Negative for difficulty urinating, frequency and vaginal pain.  Musculoskeletal: Positive for arthralgias. Negative for back pain and gait problem.  Skin: Negative for pallor and rash.  Neurological: Negative for dizziness, tremors, weakness, numbness and headaches.  Psychiatric/Behavioral: Negative for confusion and sleep disturbance.    Objective:  BP (!) 148/82 (BP Location: Left Arm, Patient Position: Sitting, Cuff Size: Normal)   Pulse 64   Temp 98.2 F (36.8 C) (Oral)   Ht 5\' 4"  (1.626 m)   Wt 128 lb (58.1 kg)   SpO2 97%   BMI 21.97 kg/m   BP Readings from Last 3 Encounters:  11/25/19 (!) 148/82  08/27/19 128/80  05/26/19 124/72    Wt Readings from Last  3 Encounters:  11/25/19 128 lb (58.1 kg)  08/27/19 128 lb (58.1 kg)  05/26/19 127 lb (57.6 kg)    Physical Exam Constitutional:      General: She is not in acute distress.    Appearance: She is well-developed.  HENT:     Head: Normocephalic.     Right Ear: External ear normal.     Left Ear: External ear normal.     Nose: Nose normal.  Eyes:     General:        Right eye: No discharge.        Left eye: No discharge.     Conjunctiva/sclera: Conjunctivae normal.     Pupils: Pupils are equal, round, and reactive to light.  Neck:     Thyroid: No thyromegaly.     Vascular: No JVD.     Trachea: No tracheal deviation.  Cardiovascular:     Rate and Rhythm: Normal rate and regular rhythm.     Heart sounds: Normal heart sounds.  Pulmonary:     Effort: No respiratory distress.     Breath sounds: No stridor. No wheezing.  Abdominal:     General: Bowel sounds are normal. There is no distension.     Palpations: Abdomen is soft. There is no mass.     Tenderness: There is no abdominal tenderness. There is no guarding or rebound.  Musculoskeletal:        General: No tenderness.     Cervical back: Normal range of motion  and neck supple.  Lymphadenopathy:     Cervical: No cervical adenopathy.  Skin:    Findings: No erythema or rash.  Neurological:     Mental Status: She is oriented to person, place, and time.     Cranial Nerves: No cranial nerve deficit.     Motor: No abnormal muscle tone.     Coordination: Coordination normal.     Deep Tendon Reflexes: Reflexes normal.  Psychiatric:        Behavior: Behavior normal.        Thought Content: Thought content normal.        Judgment: Judgment normal.     Lab Results  Component Value Date   WBC 5.6 03/17/2019   HGB 14.1 03/17/2019   HCT 42.2 03/17/2019   PLT 200.0 03/17/2019   GLUCOSE 90 08/25/2019   CHOL 294 (H) 03/17/2019   TRIG 146.0 03/17/2019   HDL 57.20 03/17/2019   LDLDIRECT 161.5 03/29/2012   LDLCALC 208 (H)  03/17/2019   ALT 97 (H) 08/25/2019   AST 70 (H) 08/25/2019   NA 134 (L) 08/25/2019   K 4.2 08/25/2019   CL 103 08/25/2019   CREATININE 1.03 08/25/2019   BUN 16 08/25/2019   CO2 22 08/25/2019   TSH 1.58 03/17/2019    US Abdomen Complete  Result Date: 04/08/2019 CLINICAL DATA:  Chronic renal insufficiency, stage 3. Elevated liver function test. CRI and elevated LFTs. EXAM: ABDOMEN ULTRASOUND COMPLETE COMPARISON:  No pertinent prior studies available for comparison. FINDINGS: Gallbladder: No gallstones or wall thickening visualized. No sonographic Murphy sign noted by sonographer. Common bile duct: Diameter: 3 mm Liver: No focal lesion identified. Within normal limits in parenchymal echogenicity. Interrogated port vein is patent on color Doppler imaging with normal direction of blood flow towards the liver. IVC: No abnormality visualized. Pancreas: Visualized portion unremarkable. Spleen: Size and appearance within normal limits. Right Kidney: Length: 9.8 cm. Echogenicity within normal limits. No mass or hydronephrosis visualized. 1.1 x 0.7 x 0.8 cm simple appearing cyst within the lower pole. Left Kidney: Length: 8.3 cm. Echogenicity within normal limits. No mass or hydronephrosis visualized. 0.9 x 0.9 x 0.7 cm simple appearing cyst within the lower pole. Abdominal aorta: No aneurysm visualized. IMPRESSION: Nonspecific renal size discrepancy with the right kidney measuring 9.8 cm in length, and the left kidney measuring 8.3 cm in length. No hydronephrosis. Small bilateral simple appearing renal cysts. Otherwise unremarkable abdominal ultrasound, as detailed. Electronically Signed   By: Kellie Simmering DO   On: 04/08/2019 14:15    Assessment & Plan:    Walker Kehr, MD

## 2019-11-25 NOTE — Patient Instructions (Signed)
We can use Cymbalta or Gabapentin for pain

## 2019-11-25 NOTE — Assessment & Plan Note (Signed)
We can use Cymbalta or Gabapentin for pain - discussed Re-start vit d

## 2019-11-28 ENCOUNTER — Ambulatory Visit
Admission: RE | Admit: 2019-11-28 | Discharge: 2019-11-28 | Disposition: A | Discharge status: Home / Self Care | Payer: Medicare Other | Source: Ambulatory Visit | Attending: Internal Medicine | Admitting: Internal Medicine

## 2019-11-28 DIAGNOSIS — K7689 Other specified diseases of liver: Secondary | ICD-10-CM | POA: Diagnosis not present

## 2019-11-28 DIAGNOSIS — R748 Abnormal levels of other serum enzymes: Secondary | ICD-10-CM

## 2019-12-08 DIAGNOSIS — Z961 Presence of intraocular lens: Secondary | ICD-10-CM | POA: Diagnosis not present

## 2019-12-08 DIAGNOSIS — H353131 Nonexudative age-related macular degeneration, bilateral, early dry stage: Secondary | ICD-10-CM | POA: Diagnosis not present

## 2019-12-08 DIAGNOSIS — H35372 Puckering of macula, left eye: Secondary | ICD-10-CM | POA: Diagnosis not present

## 2019-12-17 DIAGNOSIS — R748 Abnormal levels of other serum enzymes: Secondary | ICD-10-CM | POA: Diagnosis not present

## 2019-12-22 DIAGNOSIS — L570 Actinic keratosis: Secondary | ICD-10-CM | POA: Diagnosis not present

## 2019-12-22 DIAGNOSIS — L718 Other rosacea: Secondary | ICD-10-CM | POA: Diagnosis not present

## 2019-12-22 DIAGNOSIS — X32XXXD Exposure to sunlight, subsequent encounter: Secondary | ICD-10-CM | POA: Diagnosis not present

## 2020-01-20 DIAGNOSIS — R748 Abnormal levels of other serum enzymes: Secondary | ICD-10-CM | POA: Diagnosis not present

## 2020-02-19 DIAGNOSIS — R748 Abnormal levels of other serum enzymes: Secondary | ICD-10-CM | POA: Diagnosis not present

## 2020-03-09 DIAGNOSIS — R748 Abnormal levels of other serum enzymes: Secondary | ICD-10-CM | POA: Diagnosis not present

## 2020-03-29 ENCOUNTER — Ambulatory Visit: Payer: Medicare Other | Admitting: Internal Medicine

## 2020-03-29 ENCOUNTER — Encounter: Payer: Self-pay | Admitting: Internal Medicine

## 2020-03-29 ENCOUNTER — Other Ambulatory Visit: Payer: Self-pay

## 2020-03-29 ENCOUNTER — Ambulatory Visit (INDEPENDENT_AMBULATORY_CARE_PROVIDER_SITE_OTHER): Payer: Medicare Other | Admitting: Internal Medicine

## 2020-03-29 DIAGNOSIS — N183 Chronic kidney disease, stage 3 unspecified: Secondary | ICD-10-CM | POA: Diagnosis not present

## 2020-03-29 DIAGNOSIS — E538 Deficiency of other specified B group vitamins: Secondary | ICD-10-CM

## 2020-03-29 DIAGNOSIS — Z8261 Family history of arthritis: Secondary | ICD-10-CM | POA: Diagnosis not present

## 2020-03-29 DIAGNOSIS — M25559 Pain in unspecified hip: Secondary | ICD-10-CM

## 2020-03-29 NOTE — Assessment & Plan Note (Signed)
Oxycodone prn No tylenol due to elevated LFTs. No NSAIDs. Steroids helped  Potential benefits of a

## 2020-03-29 NOTE — Progress Notes (Signed)
Subjective:  Patient ID: Amanda Cole, female    DOB: [birth year]  Age: 79 y.o. MRN: [id number]  CC: Follow-up (4 MONTH F/U)   HPI Amanda Cole presents for autoimmune hepatitis, R hip pain, CRF f/u C/o LBP R sacrum area    Per Dr Earlean Shawl: "Elevated Liver enzymes Amanda Cole returns to the office with elevated liver enzymes 1-2x normal limit. Lab evaluation was significant only for remarkably elevated Hep2 ANA level of 1:640. Ultrasound with elastography suggests F1 fibrosis level. There is concern for possible underlying AIH. We discussed trial of budesonide 9 mg vs. Percutaneous liver biopsy to get a definitive diagnosis. She would prefer to trial a less invasive option first and will complete a 1 month trial of budesonide and then recheck liver enzymes. If improvement than we will continue to taper medication as tolerated. If no response to medication than liver biopsy may be pursued. Follow-up to be determined based on medication response.   Plan: -Budesonide 9 mg QD x 30 days -Recheck CMET 1 month -Consider liver biopsy if no response to medication -F/u to be determined based on lab results"   Outpatient Medications Prior to Visit  Medication Sig Dispense Refill  . oxyCODONE (ROXICODONE) 5 MG immediate release tablet Take 0.5-1 tablets (2.5-5 mg total) by mouth every 6 (six) hours as needed for severe pain. (Patient not taking: Reported on 11/25/2019) 20 tablet 0  . aspirin EC 81 MG tablet Take 81 mg by mouth daily. (Patient not taking: Reported on 11/25/2019)    . b complex vitamins tablet Take 1 tablet by mouth daily. (Patient not taking: Reported on 03/29/2020) 100 tablet 3  . BIOTIN PO Take by mouth. (Patient not taking: Reported on 11/25/2019)    . Cholecalciferol (VITAMIN D3) 50 MCG (2000 UT) capsule Take 1 capsule (2,000 Units total) by mouth daily. (Patient not taking: Reported on 03/29/2020) 100 capsule 3   No facility-administered medications prior to visit.     ROS: Review of Systems  Constitutional: Negative for activity change, appetite change, chills, fatigue and unexpected weight change.  HENT: Negative for congestion, mouth sores and sinus pressure.   Eyes: Negative for visual disturbance.  Respiratory: Negative for cough and chest tightness.   Gastrointestinal: Negative for abdominal pain and nausea.  Genitourinary: Negative for difficulty urinating, frequency and vaginal pain.  Musculoskeletal: Positive for arthralgias. Negative for back pain and gait problem.  Skin: Negative for pallor and rash.  Neurological: Negative for dizziness, tremors, weakness, numbness and headaches.  Psychiatric/Behavioral: Negative for confusion and sleep disturbance.    Objective:  BP 112/70 (BP Location: Left Arm)   Pulse 73   Temp 98 F (36.7 C) (Oral)   Wt 128 lb 9.6 oz (58.3 kg)   SpO2 95%   BMI 22.07 kg/m   BP Readings from Last 3 Encounters:  03/29/20 112/70  11/25/19 (!) 148/82  08/27/19 128/80    Wt Readings from Last 3 Encounters:  03/29/20 128 lb 9.6 oz (58.3 kg)  11/25/19 128 lb (58.1 kg)  08/27/19 128 lb (58.1 kg)    Physical Exam Constitutional:      General: She is not in acute distress.    Appearance: She is well-developed.  HENT:     Head: Normocephalic.     Right Ear: External ear normal.     Left Ear: External ear normal.     Nose: Nose normal.  Eyes:     General:        Right eye: No  discharge.        Left eye: No discharge.     Conjunctiva/sclera: Conjunctivae normal.     Pupils: Pupils are equal, round, and reactive to light.  Neck:     Thyroid: No thyromegaly.     Vascular: No JVD.     Trachea: No tracheal deviation.  Cardiovascular:     Rate and Rhythm: Normal rate and regular rhythm.     Heart sounds: Normal heart sounds.  Pulmonary:     Effort: No respiratory distress.     Breath sounds: No stridor. No wheezing.  Abdominal:     General: Bowel sounds are normal. There is no distension.      Palpations: Abdomen is soft. There is no mass.     Tenderness: There is no abdominal tenderness. There is no guarding or rebound.  Musculoskeletal:        General: Tenderness present.     Cervical back: Normal range of motion and neck supple.  Lymphadenopathy:     Cervical: No cervical adenopathy.  Skin:    Findings: No erythema or rash.  Neurological:     Cranial Nerves: No cranial nerve deficit.     Motor: No abnormal muscle tone.     Coordination: Coordination normal.     Deep Tendon Reflexes: Reflexes normal.  Psychiatric:        Behavior: Behavior normal.        Thought Content: Thought content normal.        Judgment: Judgment normal.   Hip w/pain R LS w/pain  Lab Results  Component Value Date   WBC 5.6 03/17/2019   HGB 14.1 03/17/2019   HCT 42.2 03/17/2019   PLT 200.0 03/17/2019   GLUCOSE 90 08/25/2019   CHOL 294 (H) 03/17/2019   TRIG 146.0 03/17/2019   HDL 57.20 03/17/2019   LDLDIRECT 161.5 03/29/2012   LDLCALC 208 (H) 03/17/2019   ALT 97 (H) 08/25/2019   AST 70 (H) 08/25/2019   NA 134 (L) 08/25/2019   K 4.2 08/25/2019   CL 103 08/25/2019   CREATININE 1.03 08/25/2019   BUN 16 08/25/2019   CO2 22 08/25/2019   TSH 1.58 03/17/2019    US ABDOMEN RUQ W/ELASTOGRAPHY  Result Date: 11/28/2019 CLINICAL DATA:  Elevated liver function test EXAM: US ABDOMEN LIMITED - RIGHT UPPER QUADRANT ULTRASOUND HEPATIC ELASTOGRAPHY TECHNIQUE: Sonography of the right upper quadrant was performed. In addition, ultrasound elastography evaluation of the liver was performed. A region of interest was placed within the right lobe of the liver. Following application of a compressive sonographic pulse, tissue compressibility was assessed. Multiple assessments were performed at the selected site. Median tissue compressibility was determined. Previously, hepatic stiffness was assessed by shear wave velocity. Based on recently published Society of Radiologists in Ultrasound consensus article,  reporting is now recommended to be performed in the SI units of pressure (kiloPascals) representing hepatic stiffness/elasticity. The obtained result is compared to the published reference standards. (cACLD = compensated Advanced Chronic Liver Disease) COMPARISON:  U/S abdomen complete 04/08/2019 FINDINGS: ULTRASOUND ABDOMEN LIMITED RIGHT UPPER QUADRANT Gallbladder: No gallstones or wall thickening visualized. No sonographic Murphy sign noted. Common bile duct: Diameter: 3.2 mm Liver: Heterogeneous echotexture. No focal liver abnormality. Portal vein is patent on color Doppler imaging with normal direction of blood flow towards the liver. ULTRASOUND HEPATIC ELASTOGRAPHY Device: Siemens Helix VTQ Patient position: Oblique Transducer 6C1 Number of measurements: 10 Hepatic segment:  8 Median kPa: 6.9 IQR: 1.1 IQR/Median kPa ratio: 0.2 Data quality:  Good Diagnostic category: < or = 9 kPa: in the absence of other known clinical signs, rules out cACLD IMPRESSION: ULTRASOUND RUQ: 1. Heterogeneous echotexture of the liver without focal abnormality. ULTRASOUND HEPATIC ELASTOGRAPHY: Median kPa:  6.9 Diagnostic category: < or = 9 kPa: in the absence of other known clinical signs, rules out cACLD The use of hepatic elastography is applicable to patients with viral hepatitis and non-alcoholic fatty liver disease. At this time, there is insufficient data for the referenced cut-off values and use in other causes of liver disease, including alcoholic liver disease. Patients, however, may be assessed by elastography and serve as their own reference standard/baseline. In patients with non-alcoholic liver disease, the values suggesting compensated advanced chronic liver disease (cACLD) may be lower, and patients may need additional testing with elasticity results of 7-9 kPa. Please note that abnormal hepatic elasticity and shear wave velocities may also be identified in clinical settings other than with hepatic fibrosis, such as:  acute hepatitis, elevated right heart and central venous pressures including use of beta blockers, veno-occlusive disease (Budd-Chiari), infiltrative processes such as mastocytosis/amyloidosis/infiltrative tumor/lymphoma, extrahepatic cholestasis, with hyperemia in the post-prandial state, and with liver transplantation. Correlation with patient history, laboratory data, and clinical condition recommended. Diagnostic Categories: < or =5 kPa: high probability of being normal < or =9 kPa: in the absence of other known clinical signs, rules out cACLD >9 kPa and ?13 kPa: suggestive of cACLD, but needs further testing >13 kPa: highly suggestive of cACLD > or =17 kPa: highly suggestive of cACLD with an increased probability of clinically significant portal hypertension Electronically Signed   By: Kerby Moors M.D.   On: 11/28/2019 14:07    Assessment & Plan:    Follow-up: No follow-ups on file.  Walker Kehr, MD

## 2020-03-29 NOTE — Assessment & Plan Note (Signed)
Per Dr Earlean Shawl: "Elevated Liver enzymes Amanda Cole returns to the office with elevated liver enzymes 1-2x normal limit. Lab evaluation was significant only for remarkably elevated Hep2 ANA level of 1:640. Ultrasound with elastography suggests F1 fibrosis level. There is concern for possible underlying AIH. We discussed trial of budesonide 9 mg vs. Percutaneous liver biopsy to get a definitive diagnosis. She would prefer to trial a less invasive option first and will complete a 1 month trial of budesonide and then recheck liver enzymes. If improvement than we will continue to taper medication as tolerated. If no response to medication than liver biopsy may be pursued. Follow-up to be determined based on medication response.   Plan: -Budesonide 9 mg QD x 30 days -Recheck CMET 1 month -Consider liver biopsy if no response to medication -F/u to be determined based on lab results"

## 2020-03-29 NOTE — Assessment & Plan Note (Signed)
On B complex 

## 2020-03-29 NOTE — Assessment & Plan Note (Addendum)
Avoiding NSAIDs  -- GFR 47 10/21

## 2020-03-31 ENCOUNTER — Ambulatory Visit: Payer: Medicare Other

## 2020-04-02 DIAGNOSIS — R748 Abnormal levels of other serum enzymes: Secondary | ICD-10-CM | POA: Diagnosis not present

## 2020-06-15 DIAGNOSIS — R748 Abnormal levels of other serum enzymes: Secondary | ICD-10-CM | POA: Diagnosis not present

## 2020-06-29 ENCOUNTER — Other Ambulatory Visit: Payer: Self-pay

## 2020-06-29 ENCOUNTER — Encounter: Payer: Self-pay | Admitting: Internal Medicine

## 2020-06-29 ENCOUNTER — Ambulatory Visit (INDEPENDENT_AMBULATORY_CARE_PROVIDER_SITE_OTHER): Payer: Medicare Other | Admitting: Internal Medicine

## 2020-06-29 VITALS — BP 112/72 | HR 70 | Temp 98.1°F | Ht 64.0 in | Wt 128.8 lb

## 2020-06-29 DIAGNOSIS — E559 Vitamin D deficiency, unspecified: Secondary | ICD-10-CM | POA: Diagnosis not present

## 2020-06-29 DIAGNOSIS — E785 Hyperlipidemia, unspecified: Secondary | ICD-10-CM | POA: Diagnosis not present

## 2020-06-29 DIAGNOSIS — R748 Abnormal levels of other serum enzymes: Secondary | ICD-10-CM

## 2020-06-29 DIAGNOSIS — Z1231 Encounter for screening mammogram for malignant neoplasm of breast: Secondary | ICD-10-CM

## 2020-06-29 DIAGNOSIS — E538 Deficiency of other specified B group vitamins: Secondary | ICD-10-CM

## 2020-06-29 DIAGNOSIS — N952 Postmenopausal atrophic vaginitis: Secondary | ICD-10-CM

## 2020-06-29 DIAGNOSIS — N183 Chronic kidney disease, stage 3 unspecified: Secondary | ICD-10-CM

## 2020-06-29 LAB — COMPREHENSIVE METABOLIC PANEL
ALT: 57 U/L — ABNORMAL HIGH (ref 0–35)
AST: 40 U/L — ABNORMAL HIGH (ref 0–37)
Albumin: 3.8 g/dL (ref 3.5–5.2)
Alkaline Phosphatase: 70 U/L (ref 39–117)
BUN: 18 mg/dL (ref 6–23)
CO2: 28 mEq/L (ref 19–32)
Calcium: 9.2 mg/dL (ref 8.4–10.5)
Chloride: 103 mEq/L (ref 96–112)
Creatinine, Ser: 1.01 mg/dL (ref 0.40–1.20)
GFR: 52.16 mL/min — ABNORMAL LOW (ref >60.00)
Glucose, Bld: 81 mg/dL (ref 70–99)
Potassium: 4.2 mEq/L (ref 3.5–5.1)
Sodium: 137 mEq/L (ref 135–145)
Total Bilirubin: 0.4 mg/dL (ref 0.2–1.2)
Total Protein: 7.1 g/dL (ref 6.0–8.3)

## 2020-06-29 LAB — URINALYSIS
Bilirubin Urine: NEGATIVE
Hgb urine dipstick: NEGATIVE
Ketones, ur: NEGATIVE
Leukocytes,Ua: NEGATIVE
Nitrite: NEGATIVE
Specific Gravity, Urine: 1.01 (ref 1.000–1.030)
Total Protein, Urine: NEGATIVE
Urine Glucose: NEGATIVE
Urobilinogen, UA: 0.2 (ref 0.0–1.0)
pH: 6 (ref 5.0–8.0)

## 2020-06-29 LAB — CBC WITH DIFFERENTIAL/PLATELET
Basophils Absolute: 0.1 10*3/uL (ref 0.0–0.1)
Basophils Relative: 1 % (ref 0.0–3.0)
Eosinophils Absolute: 0.1 10*3/uL (ref 0.0–0.7)
Eosinophils Relative: 1.8 % (ref 0.0–5.0)
HCT: 39.9 % (ref 36.0–46.0)
Hemoglobin: 13.4 g/dL (ref 12.0–15.0)
Lymphocytes Relative: 24.6 % (ref 12.0–46.0)
Lymphs Abs: 1.4 10*3/uL (ref 0.7–4.0)
MCHC: 33.6 g/dL (ref 30.0–36.0)
MCV: 89 fl (ref 78.0–100.0)
Monocytes Absolute: 0.7 10*3/uL (ref 0.1–1.0)
Monocytes Relative: 13.1 % — ABNORMAL HIGH (ref 3.0–12.0)
Neutro Abs: 3.4 10*3/uL (ref 1.4–7.7)
Neutrophils Relative %: 59.5 % (ref 43.0–77.0)
Platelets: 192 10*3/uL (ref 150.0–400.0)
RBC: 4.48 Mil/uL (ref 3.87–5.11)
RDW: 14.5 % (ref 11.5–15.5)
WBC: 5.7 10*3/uL (ref 4.0–10.5)

## 2020-06-29 LAB — MICROALBUMIN / CREATININE URINE RATIO
Creatinine,U: 54 mg/dL
Microalb Creat Ratio: 1.3 mg/g (ref 0.0–30.0)
Microalb, Ur: <0.7 mg/dL (ref 0.0–1.9)

## 2020-06-29 LAB — VITAMIN D 25 HYDROXY (VIT D DEFICIENCY, FRACTURES): VITD: 41.77 ng/mL (ref 30.00–100.00)

## 2020-06-29 LAB — TSH: TSH: 2.05 u[IU]/mL (ref 0.35–4.50)

## 2020-06-29 LAB — VITAMIN B12: Vitamin B-12: 950 pg/mL — ABNORMAL HIGH (ref 211–911)

## 2020-06-29 MED ORDER — ESTROGENS, CONJUGATED 0.625 MG/GM VA CREA: TOPICAL_CREAM | VAGINAL | 3 refills | Status: DC

## 2020-06-29 MED ORDER — VITAMIN D3 50 MCG (2000 UT) PO CAPS: 2000.0000 [IU] | ORAL_CAPSULE | Freq: Every day | ORAL | 3 refills | Status: AC

## 2020-06-29 NOTE — Assessment & Plan Note (Signed)
Premarin cream pv Mammogram

## 2020-06-29 NOTE — Assessment & Plan Note (Signed)
Pt declined statins 

## 2020-06-29 NOTE — Progress Notes (Signed)
Subjective:  Patient ID: Amanda Cole, female    DOB: 19-Dec-1940  Age: 80 y.o. MRN: [id number]  CC: Follow-up (3 month f/u)   HPI Amanda Cole presents for arthritis, elevated LFTs, vaginal dryness, B 12 def f/u   Outpatient Medications Prior to Visit  Medication Sig Dispense Refill  . cyanocobalamin 1000 MCG tablet Take 1,000 mcg by mouth daily.    . Multiple Vitamins-Minerals (ONE-A-DAY 50 PLUS PO) Take 1 tablet by mouth daily.    Marland Kitchen oxyCODONE (ROXICODONE) 5 MG immediate release tablet Take 0.5-1 tablets (2.5-5 mg total) by mouth every 6 (six) hours as needed for severe pain. (Patient not taking: Reported on 11/25/2019) 20 tablet 0   No facility-administered medications prior to visit.    ROS: Review of Systems  Constitutional: Negative for activity change, appetite change, chills, fatigue and unexpected weight change.  HENT: Negative for congestion, mouth sores and sinus pressure.   Eyes: Negative for visual disturbance.  Respiratory: Negative for cough and chest tightness.   Gastrointestinal: Negative for abdominal pain and nausea.  Genitourinary: Negative for difficulty urinating, frequency and vaginal pain.  Musculoskeletal: Positive for arthralgias. Negative for back pain and gait problem.  Skin: Negative for pallor and rash.  Neurological: Negative for dizziness, tremors, weakness, numbness and headaches.  Psychiatric/Behavioral: Negative for confusion and sleep disturbance.    Objective:  BP 112/72 (BP Location: Left Arm)   Pulse 70   Temp 98.1 F (36.7 C) (Oral)   Ht 5\' 4"  (1.626 m)   Wt 128 lb 12.8 oz (58.4 kg)   SpO2 97%   BMI 22.11 kg/m   BP Readings from Last 3 Encounters:  06/29/20 112/72  03/29/20 112/70  11/25/19 (!) 148/82    Wt Readings from Last 3 Encounters:  06/29/20 128 lb 12.8 oz (58.4 kg)  03/29/20 128 lb 9.6 oz (58.3 kg)  11/25/19 128 lb (58.1 kg)    Physical Exam Constitutional:      General: She is not in acute distress.     Appearance: She is well-developed.  HENT:     Head: Normocephalic.     Right Ear: External ear normal.     Left Ear: External ear normal.     Nose: Nose normal.     Mouth/Throat:     Mouth: Oropharynx is clear and moist.  Eyes:     General:        Right eye: No discharge.        Left eye: No discharge.     Conjunctiva/sclera: Conjunctivae normal.     Pupils: Pupils are equal, round, and reactive to light.  Neck:     Thyroid: No thyromegaly.     Vascular: No JVD.     Trachea: No tracheal deviation.  Cardiovascular:     Rate and Rhythm: Normal rate and regular rhythm.     Heart sounds: Normal heart sounds.  Pulmonary:     Effort: No respiratory distress.     Breath sounds: No stridor. No wheezing.  Abdominal:     General: Bowel sounds are normal. There is no distension.     Palpations: Abdomen is soft. There is no mass.     Tenderness: There is no abdominal tenderness. There is no guarding or rebound.  Musculoskeletal:        General: Tenderness present. No edema.     Cervical back: Normal range of motion and neck supple.  Lymphadenopathy:     Cervical: No cervical adenopathy.  Skin:  Findings: No erythema or rash.  Neurological:     Cranial Nerves: No cranial nerve deficit.     Motor: No abnormal muscle tone.     Coordination: Coordination normal.     Deep Tendon Reflexes: Reflexes normal.  Psychiatric:        Mood and Affect: Mood and affect normal.        Behavior: Behavior normal.        Thought Content: Thought content normal.        Judgment: Judgment normal.   arthritic deformities - hands  Lab Results  Component Value Date   WBC 5.6 03/17/2019   HGB 14.1 03/17/2019   HCT 42.2 03/17/2019   PLT 200.0 03/17/2019   GLUCOSE 90 08/25/2019   CHOL 294 (H) 03/17/2019   TRIG 146.0 03/17/2019   HDL 57.20 03/17/2019   LDLDIRECT 161.5 03/29/2012   LDLCALC 208 (H) 03/17/2019   ALT 97 (H) 08/25/2019   AST 70 (H) 08/25/2019   NA 134 (L) 08/25/2019   K 4.2  08/25/2019   CL 103 08/25/2019   CREATININE 1.03 08/25/2019   BUN 16 08/25/2019   CO2 22 08/25/2019   TSH 1.58 03/17/2019    US ABDOMEN RUQ W/ELASTOGRAPHY  Result Date: 11/28/2019 CLINICAL DATA:  Elevated liver function test EXAM: US ABDOMEN LIMITED - RIGHT UPPER QUADRANT ULTRASOUND HEPATIC ELASTOGRAPHY TECHNIQUE: Sonography of the right upper quadrant was performed. In addition, ultrasound elastography evaluation of the liver was performed. A region of interest was placed within the right lobe of the liver. Following application of a compressive sonographic pulse, tissue compressibility was assessed. Multiple assessments were performed at the selected site. Median tissue compressibility was determined. Previously, hepatic stiffness was assessed by shear wave velocity. Based on recently published Society of Radiologists in Ultrasound consensus article, reporting is now recommended to be performed in the SI units of pressure (kiloPascals) representing hepatic stiffness/elasticity. The obtained result is compared to the published reference standards. (cACLD = compensated Advanced Chronic Liver Disease) COMPARISON:  U/S abdomen complete 04/08/2019 FINDINGS: ULTRASOUND ABDOMEN LIMITED RIGHT UPPER QUADRANT Gallbladder: No gallstones or wall thickening visualized. No sonographic Murphy sign noted. Common bile duct: Diameter: 3.2 mm Liver: Heterogeneous echotexture. No focal liver abnormality. Portal vein is patent on color Doppler imaging with normal direction of blood flow towards the liver. ULTRASOUND HEPATIC ELASTOGRAPHY Device: Siemens Helix VTQ Patient position: Oblique Transducer 6C1 Number of measurements: 10 Hepatic segment:  8 Median kPa: 6.9 IQR: 1.1 IQR/Median kPa ratio: 0.2 Data quality:  Good Diagnostic category: < or = 9 kPa: in the absence of other known clinical signs, rules out cACLD IMPRESSION: ULTRASOUND RUQ: 1. Heterogeneous echotexture of the liver without focal abnormality. ULTRASOUND  HEPATIC ELASTOGRAPHY: Median kPa:  6.9 Diagnostic category: < or = 9 kPa: in the absence of other known clinical signs, rules out cACLD The use of hepatic elastography is applicable to patients with viral hepatitis and non-alcoholic fatty liver disease. At this time, there is insufficient data for the referenced cut-off values and use in other causes of liver disease, including alcoholic liver disease. Patients, however, may be assessed by elastography and serve as their own reference standard/baseline. In patients with non-alcoholic liver disease, the values suggesting compensated advanced chronic liver disease (cACLD) may be lower, and patients may need additional testing with elasticity results of 7-9 kPa. Please note that abnormal hepatic elasticity and shear wave velocities may also be identified in clinical settings other than with hepatic fibrosis, such as: acute hepatitis, elevated right  heart and central venous pressures including use of beta blockers, veno-occlusive disease (Budd-Chiari), infiltrative processes such as mastocytosis/amyloidosis/infiltrative tumor/lymphoma, extrahepatic cholestasis, with hyperemia in the post-prandial state, and with liver transplantation. Correlation with patient history, laboratory data, and clinical condition recommended. Diagnostic Categories: < or =5 kPa: high probability of being normal < or =9 kPa: in the absence of other known clinical signs, rules out cACLD >9 kPa and ?13 kPa: suggestive of cACLD, but needs further testing >13 kPa: highly suggestive of cACLD > or =17 kPa: highly suggestive of cACLD with an increased probability of clinically significant portal hypertension Electronically Signed   By: Kerby Moors M.D.   On: 11/28/2019 14:07    Assessment & Plan:    Walker Kehr, MD

## 2020-06-29 NOTE — Assessment & Plan Note (Signed)
On B complex 

## 2020-06-29 NOTE — Assessment & Plan Note (Addendum)
LFTs  Dr Earlean Shawl - on Budesonide

## 2020-06-29 NOTE — Assessment & Plan Note (Addendum)
BMET Stage 3a Hydrate well

## 2020-07-23 DIAGNOSIS — R748 Abnormal levels of other serum enzymes: Secondary | ICD-10-CM | POA: Diagnosis not present

## 2020-08-12 DIAGNOSIS — R7989 Other specified abnormal findings of blood chemistry: Secondary | ICD-10-CM | POA: Diagnosis not present

## 2020-09-16 DIAGNOSIS — R7989 Other specified abnormal findings of blood chemistry: Secondary | ICD-10-CM | POA: Diagnosis not present

## 2020-09-29 ENCOUNTER — Telehealth: Payer: Self-pay | Admitting: Internal Medicine

## 2020-09-29 NOTE — Telephone Encounter (Signed)
Seeking advice  Patient calling to report she tested positive for COVID today She would like advice on OTC medications She has fever, cough, nasal congestion   Please advise

## 2020-09-30 NOTE — Telephone Encounter (Signed)
For a mild COVID-19 case she can take zinc 50 mg a day for 1 week, vitamin C 1000 mg daily for 1 week, vitamin D2 50,000 units weekly for 2 months (unless you are taking vitamin D daily already). Take Allegra or Benadryl.  Maintain good oral hydration and take Tylenol for high fever.  VOV if needed Thx

## 2020-09-30 NOTE — Telephone Encounter (Signed)
   Patient made aware of Dr Plotnikov's advice

## 2020-10-04 ENCOUNTER — Institutional Professional Consult (permissible substitution): Payer: Medicare Other | Admitting: Plastic Surgery

## 2020-11-09 DIAGNOSIS — R748 Abnormal levels of other serum enzymes: Secondary | ICD-10-CM | POA: Diagnosis not present

## 2020-11-09 DIAGNOSIS — R7989 Other specified abnormal findings of blood chemistry: Secondary | ICD-10-CM | POA: Diagnosis not present

## 2020-12-14 ENCOUNTER — Other Ambulatory Visit: Payer: Self-pay

## 2020-12-14 ENCOUNTER — Ambulatory Visit
Admission: RE | Admit: 2020-12-14 | Discharge: 2020-12-14 | Disposition: A | Discharge status: Home / Self Care | Payer: Medicare Other | Source: Ambulatory Visit | Attending: Internal Medicine | Admitting: Internal Medicine

## 2020-12-14 DIAGNOSIS — Z1231 Encounter for screening mammogram for malignant neoplasm of breast: Secondary | ICD-10-CM | POA: Diagnosis not present

## 2020-12-20 ENCOUNTER — Telehealth: Payer: Self-pay | Admitting: Internal Medicine

## 2020-12-20 NOTE — Telephone Encounter (Signed)
LVM for pt to rtn my call to [phone] to schedule AWV with [facility]. Please schedule this appt is pt calls the office or comes in.

## 2020-12-28 DIAGNOSIS — Z1283 Encounter for screening for malignant neoplasm of skin: Secondary | ICD-10-CM | POA: Diagnosis not present

## 2020-12-28 DIAGNOSIS — L820 Inflamed seborrheic keratosis: Secondary | ICD-10-CM | POA: Diagnosis not present

## 2020-12-28 DIAGNOSIS — D225 Melanocytic nevi of trunk: Secondary | ICD-10-CM | POA: Diagnosis not present

## 2020-12-30 ENCOUNTER — Ambulatory Visit (INDEPENDENT_AMBULATORY_CARE_PROVIDER_SITE_OTHER): Payer: Medicare Other | Admitting: Internal Medicine

## 2020-12-30 ENCOUNTER — Other Ambulatory Visit: Payer: Self-pay

## 2020-12-30 ENCOUNTER — Encounter: Payer: Self-pay | Admitting: Internal Medicine

## 2020-12-30 DIAGNOSIS — E785 Hyperlipidemia, unspecified: Secondary | ICD-10-CM

## 2020-12-30 DIAGNOSIS — N183 Chronic kidney disease, stage 3 unspecified: Secondary | ICD-10-CM | POA: Diagnosis not present

## 2020-12-30 DIAGNOSIS — R748 Abnormal levels of other serum enzymes: Secondary | ICD-10-CM

## 2020-12-30 DIAGNOSIS — M1990 Unspecified osteoarthritis, unspecified site: Secondary | ICD-10-CM

## 2020-12-30 DIAGNOSIS — E538 Deficiency of other specified B group vitamins: Secondary | ICD-10-CM | POA: Diagnosis not present

## 2020-12-30 LAB — URINALYSIS
Bilirubin Urine: NEGATIVE
Hgb urine dipstick: NEGATIVE
Ketones, ur: NEGATIVE
Leukocytes,Ua: NEGATIVE
Nitrite: NEGATIVE
Specific Gravity, Urine: 1.015 (ref 1.000–1.030)
Total Protein, Urine: NEGATIVE
Urine Glucose: NEGATIVE
Urobilinogen, UA: 0.2 (ref 0.0–1.0)
pH: 5.5 (ref 5.0–8.0)

## 2020-12-30 LAB — CBC WITH DIFFERENTIAL/PLATELET
Basophils Absolute: 0 10*3/uL (ref 0.0–0.1)
Basophils Relative: 1.1 % (ref 0.0–3.0)
Eosinophils Absolute: 0.2 10*3/uL (ref 0.0–0.7)
Eosinophils Relative: 3.9 % (ref 0.0–5.0)
HCT: 40.3 % (ref 36.0–46.0)
Hemoglobin: 13.2 g/dL (ref 12.0–15.0)
Lymphocytes Relative: 27.8 % (ref 12.0–46.0)
Lymphs Abs: 1.3 10*3/uL (ref 0.7–4.0)
MCHC: 32.9 g/dL (ref 30.0–36.0)
MCV: 87.9 fl (ref 78.0–100.0)
Monocytes Absolute: 0.7 10*3/uL (ref 0.1–1.0)
Monocytes Relative: 14.6 % — ABNORMAL HIGH (ref 3.0–12.0)
Neutro Abs: 2.4 10*3/uL (ref 1.4–7.7)
Neutrophils Relative %: 52.6 % (ref 43.0–77.0)
Platelets: 197 10*3/uL (ref 150.0–400.0)
RBC: 4.58 Mil/uL (ref 3.87–5.11)
RDW: 15 % (ref 11.5–15.5)
WBC: 4.5 10*3/uL (ref 4.0–10.5)

## 2020-12-30 LAB — LIPID PANEL
Cholesterol: 256 mg/dL — ABNORMAL HIGH (ref 0–200)
HDL: 58 mg/dL (ref >39.00)
LDL Cholesterol: 170 mg/dL — ABNORMAL HIGH (ref 0–99)
NonHDL: 198.38
Total CHOL/HDL Ratio: 4
Triglycerides: 140 mg/dL (ref 0.0–149.0)
VLDL: 28 mg/dL (ref 0.0–40.0)

## 2020-12-30 LAB — TSH: TSH: 2.81 u[IU]/mL (ref 0.35–5.50)

## 2020-12-30 NOTE — Progress Notes (Signed)
Subjective:  Patient ID: Amanda Cole, female    DOB: [birth year]  Age: 80 y.o. MRN: [id number]  CC: Follow-up (6 MONTH F/U)   HPI Amanda Cole presents for elevated LFT - seeing Dr Earlean Shawl, B12 and Vit D def Follow-up on chronic renal sufficiency, arthritis  Outpatient Medications Prior to Visit  Medication Sig Dispense Refill   B Complex Vitamins (VITAMIN B COMPLEX) TABS Take 1 tablet by mouth daily. Take 1 tablet by mouth daily     Cholecalciferol (VITAMIN D3) 50 MCG (2000 UT) capsule Take 1 capsule (2,000 Units total) by mouth daily. 100 capsule 3   conjugated estrogens (PREMARIN) vaginal cream Use PV as directed 42.5 g 3   Multiple Vitamins-Minerals (ONE-A-DAY 50 PLUS PO) Take 1 tablet by mouth daily.     cyanocobalamin 1000 MCG tablet Take 1,000 mcg by mouth daily. (Patient not taking: Reported on 12/30/2020)     No facility-administered medications prior to visit.    ROS: Review of Systems  Constitutional:  Negative for activity change, appetite change, chills, fatigue and unexpected weight change.  HENT:  Negative for congestion, mouth sores and sinus pressure.   Eyes:  Negative for visual disturbance.  Respiratory:  Negative for cough and chest tightness.   Gastrointestinal:  Negative for abdominal pain and nausea.  Genitourinary:  Negative for difficulty urinating, frequency and vaginal pain.  Musculoskeletal:  Negative for back pain and gait problem.  Skin:  Negative for pallor and rash.  Neurological:  Negative for dizziness, tremors, weakness, numbness and headaches.  Psychiatric/Behavioral:  Negative for confusion and sleep disturbance.    Objective:  BP 122/82 (BP Location: Left Arm)   Pulse 66   Temp 98 F (36.7 C) (Oral)   Ht '5\' 4"'$  (1.626 m)   Wt 124 lb 12.8 oz (56.6 kg)   SpO2 96%   BMI 21.42 kg/m   BP Readings from Last 3 Encounters:  12/30/20 122/82  06/29/20 112/72  03/29/20 112/70    Wt Readings from Last 3 Encounters:  12/30/20 124 lb  12.8 oz (56.6 kg)  06/29/20 128 lb 12.8 oz (58.4 kg)  03/29/20 128 lb 9.6 oz (58.3 kg)    Physical Exam Constitutional:      General: She is not in acute distress.    Appearance: She is well-developed. She is obese.  HENT:     Head: Normocephalic.     Right Ear: External ear normal.     Left Ear: External ear normal.     Nose: Nose normal.  Eyes:     General:        Right eye: No discharge.        Left eye: No discharge.     Conjunctiva/sclera: Conjunctivae normal.     Pupils: Pupils are equal, round, and reactive to light.  Neck:     Thyroid: No thyromegaly.     Vascular: No JVD.     Trachea: No tracheal deviation.  Cardiovascular:     Rate and Rhythm: Normal rate and regular rhythm.     Heart sounds: Normal heart sounds.  Pulmonary:     Effort: No respiratory distress.     Breath sounds: No stridor. No wheezing.  Abdominal:     General: Bowel sounds are normal. There is no distension.     Palpations: Abdomen is soft. There is no mass.     Tenderness: no abdominal tenderness There is no guarding or rebound.  Musculoskeletal:  General: No tenderness.     Cervical back: Normal range of motion and neck supple. No rigidity.  Lymphadenopathy:     Cervical: No cervical adenopathy.  Skin:    Findings: No erythema or rash.  Neurological:     Mental Status: She is oriented to person, place, and time.     Cranial Nerves: No cranial nerve deficit.     Motor: No abnormal muscle tone.     Coordination: Coordination normal.     Gait: Gait normal.     Deep Tendon Reflexes: Reflexes normal.  Psychiatric:        Behavior: Behavior normal.        Thought Content: Thought content normal.        Judgment: Judgment normal.  Hands w/OA def  Lab Results  Component Value Date   WBC 5.7 06/29/2020   HGB 13.4 06/29/2020   HCT 39.9 06/29/2020   PLT 192.0 06/29/2020   GLUCOSE 81 06/29/2020   CHOL 294 (H) 03/17/2019   TRIG 146.0 03/17/2019   HDL 57.20 03/17/2019   LDLDIRECT  161.5 03/29/2012   LDLCALC 208 (H) 03/17/2019   ALT 57 (H) 06/29/2020   AST 40 (H) 06/29/2020   NA 137 06/29/2020   K 4.2 06/29/2020   CL 103 06/29/2020   CREATININE 1.01 06/29/2020   BUN 18 06/29/2020   CO2 28 06/29/2020   TSH 2.05 06/29/2020   MICROALBUR <0.7 06/29/2020    MM DIGITAL SCREENING BILATERAL  Result Date: 12/17/2020 CLINICAL DATA:  Screening. EXAM: DIGITAL SCREENING BILATERAL MAMMOGRAM WITH CAD TECHNIQUE: Bilateral screening digital craniocaudal and mediolateral oblique mammograms were obtained. The images were evaluated with computer-aided detection. COMPARISON:  None. ACR Breast Density Category b: There are scattered areas of fibroglandular density. FINDINGS: There are no findings suspicious for malignancy. IMPRESSION: No mammographic evidence of malignancy. A result letter of this screening mammogram will be mailed directly to the patient. RECOMMENDATION: Screening mammogram in one year. (Code:SM-B-01Y) BI-RADS CATEGORY  1: Negative. Electronically Signed   By: Kristopher Oppenheim M.D.   On: 12/17/2020 14:34    Assessment & Plan:     Walker Kehr, MD

## 2020-12-30 NOTE — Assessment & Plan Note (Addendum)
Chronic chronic problem Etiology: ?autoimmune vs other  F/u w/Dr Medoff -  Budesonide stopped in 5/22

## 2020-12-30 NOTE — Patient Instructions (Signed)
For a mild COVID-19 case - take zinc 50 mg a day for 1 week, vitamin C 1000 mg daily for 1 week, vitamin D2 50,000 units weekly for 2 months (unless  taking vitamin D daily already), an antioxidant Quercetin 500 mg twice a day for 1 week (if you can get it quick enough). Take Allegra or Benadryl.  Maintain good oral hydration and take Tylenol for high fever.  Call if problems. Isolate for 5 days, then wear a mask for 5 days per CDC.  

## 2020-12-30 NOTE — Assessment & Plan Note (Signed)
On B complex 

## 2020-12-30 NOTE — Assessment & Plan Note (Signed)
Pt declined statins 

## 2021-01-02 NOTE — Assessment & Plan Note (Signed)
On vitamin D.  Tylenol as needed

## 2021-01-02 NOTE — Assessment & Plan Note (Signed)
Hydrate well.  Avoiding NSAIDs.  Will monitor GFR

## 2021-01-11 DIAGNOSIS — R7989 Other specified abnormal findings of blood chemistry: Secondary | ICD-10-CM | POA: Diagnosis not present

## 2021-02-14 DIAGNOSIS — R7989 Other specified abnormal findings of blood chemistry: Secondary | ICD-10-CM | POA: Diagnosis not present

## 2021-03-15 DIAGNOSIS — H43811 Vitreous degeneration, right eye: Secondary | ICD-10-CM | POA: Diagnosis not present

## 2021-03-15 DIAGNOSIS — H35372 Puckering of macula, left eye: Secondary | ICD-10-CM | POA: Diagnosis not present

## 2021-03-15 DIAGNOSIS — Z961 Presence of intraocular lens: Secondary | ICD-10-CM | POA: Diagnosis not present

## 2021-03-15 DIAGNOSIS — H353131 Nonexudative age-related macular degeneration, bilateral, early dry stage: Secondary | ICD-10-CM | POA: Diagnosis not present

## 2021-04-15 ENCOUNTER — Ambulatory Visit: Payer: Medicare Other | Admitting: Nurse Practitioner

## 2021-05-24 ENCOUNTER — Other Ambulatory Visit: Payer: Self-pay | Admitting: Internal Medicine

## 2021-05-24 DIAGNOSIS — R7989 Other specified abnormal findings of blood chemistry: Secondary | ICD-10-CM

## 2021-05-26 DIAGNOSIS — R7989 Other specified abnormal findings of blood chemistry: Secondary | ICD-10-CM | POA: Diagnosis not present

## 2021-06-01 ENCOUNTER — Ambulatory Visit: Payer: Self-pay

## 2021-06-01 ENCOUNTER — Ambulatory Visit: Payer: Medicare Other | Admitting: Orthopaedic Surgery

## 2021-06-01 ENCOUNTER — Other Ambulatory Visit: Payer: Self-pay

## 2021-06-01 ENCOUNTER — Encounter: Payer: Self-pay | Admitting: Orthopaedic Surgery

## 2021-06-01 VITALS — Ht 63.5 in | Wt 125.0 lb

## 2021-06-01 DIAGNOSIS — G8929 Other chronic pain: Secondary | ICD-10-CM

## 2021-06-01 DIAGNOSIS — M5441 Lumbago with sciatica, right side: Secondary | ICD-10-CM | POA: Diagnosis not present

## 2021-06-01 DIAGNOSIS — M25551 Pain in right hip: Secondary | ICD-10-CM | POA: Diagnosis not present

## 2021-06-01 DIAGNOSIS — M545 Low back pain, unspecified: Secondary | ICD-10-CM | POA: Insufficient documentation

## 2021-06-01 NOTE — Progress Notes (Signed)
Office Visit Note   Patient: Amanda Cole           Date of Birth: 11/05/1940           MRN: [id number] Visit Date: 06/01/2021              Requested by: Amanda Anger, MD [place],  [place] [address] PCP: Cole, Amanda Lacks, MD   Assessment & Plan: Visit Diagnoses:  1. Pain in right hip   2. Chronic right-sided low back pain with right-sided sciatica   3. Pain in joint involving right pelvic region and thigh     Plan: Amanda Cole is accompanied by her husband and complaining of back, right buttock and right groin pain.  X-rays have demonstrated some degenerative changes of the lumbar spine.  Films of the pelvis demonstrate osteoarthritis of the right hip.  I think her pain is a combination of both hip and back pain.  She might have an element of radiculopathy with some discomfort as far distally as her right calf.  She did have some pain with internal and external rotation of her right hip.  Long discussion regarding diagnosis.  I would like Amanda Cole to evaluate her for injecting her right hip and will try a course of physical therapy for her lumbar spine.  If no improvement would consider an MRI scan of her back  Follow-Up Instructions: Return in about 1 month (around 07/02/2021).   Orders:  Orders Placed This Encounter  Procedures   XR Lumbar Spine 2-3 Views   XR Pelvis 1-2 Views   Ambulatory referral to Physical Medicine Rehab   Ambulatory referral to Physical Therapy   No orders of the defined types were placed in this encounter.     Procedures: No procedures performed   Clinical Data: No additional findings.   Subjective: Chief Complaint  Patient presents with   Right Hip - Pain  Patient presents today with right hip pain. She said that she has been having pain for 30 years. Over time it has gotten worse. She has pain in her groin, buttock, and lower back. She states that she hurts constantly. She has taken Ibuprofen and Tylenol for  many years and now has damage to her kidney and liver. She is currently not taking anything for pain.  Has had pain in her low back or buttock right groin and anterior thigh.  Occasionally she will have some discomfort as far distally as her right calf.  HPI  Review of Systems   Objective: Vital Signs: Ht 5' 3.5" (1.613 m)    Wt 125 lb (56.7 kg)    BMI 21.80 kg/m   Physical Exam Constitutional:      Appearance: She is well-developed.  Pulmonary:     Effort: Pulmonary effort is normal.  Skin:    General: Skin is warm and dry.  Neurological:     Mental Status: She is alert and oriented to person, place, and time.  Psychiatric:        Behavior: Behavior normal.    Ortho Exam awake alert and oriented x3 and comfortable sitting.  She had some discomfort in her right groin and the extremes of internal or external rotation.  No pain with flexion or extension.  Motor exam intact.  Sensory exam appears to be intact.  Some very minimal percussible tenderness lower lumbar spine more to the right than the left.  No SI joint pain.  No pain over the greater  trochanter.  Specialty Comments:  No specialty comments available.  Imaging: XR Lumbar Spine 2-3 Views  Result Date: 06/01/2021 AP and lateral lumbar spine did not demonstrate any scoliosis or listhesis.  There are degenerative changes at L4-5 and L5-S1 facet joints with sclerotic changes.  There is decrease in the disc space between L5 and S1.  Calcification of the abdominal aorta at the level of L4 and L5 but without aneurysmal dilatation.  Films are consistent with degenerative lumbar spine predominantly in the lower 2 levels Lumbar levels  XR Pelvis 1-2 Views  Result Date: 06/01/2021 AP the pelvis demonstrate some degenerative changes of both hips.  There are more predominant in the right hand there is narrowing of the superior joint space and subchondral changes on both sides of the joint.  Films are consistent with osteoarthritis.  No  acute changes    PMFS History: Patient Active Problem List   Diagnosis Date Noted   Low back pain 06/01/2021   Atrophic vaginitis 06/29/2020   Elevated liver enzymes 08/27/2019   Chronic renal insufficiency, stage 3 (moderate) ([place]) 03/17/2019   Sleep disorder 06/26/2018   Bilateral hand pain- chronic in DIP and PIP jts 06/26/2018   Family history of rheumatoid arthritis 06/26/2018   Dysuria 05/16/2016   Seborrheic dermatitis 06/22/2015   Clavicle enlargement 05/22/2014   Well adult exam 02/29/2012   Hand pain, left 02/29/2012   LOW BACK PAIN, ACUTE 03/07/2010   Vitamin B12 deficiency 02/01/2009   PALPITATIONS 02/01/2009   CYSTITIS 05/29/2008   HIP PAIN 05/29/2008   HAND PAIN, LEFT 05/29/2008   HEADACHE 01/10/2008   RASH-NONVESICULAR 11/01/2007   HERPES ZOSTER OPHTHALMICUS 10/12/2007   HEARING IMPAIRMENT 10/03/2007   ALLERGIC RHINITIS 10/03/2007   Chronic inflammatory arthritis 10/03/2007   Dyslipidemia 05/14/2007   SINUSITIS- ACUTE-NOS 05/14/2007   Past Medical History:  Diagnosis Date   Cataract     Family History  Problem Relation Age of Onset   Arthritis Mother        RA    Past Surgical History:  Procedure Laterality Date   CATARACT EXTRACTION     B   Social History   Occupational History   Not on file  Tobacco Use   Smoking status: Never   Smokeless tobacco: Never  Vaping Use   Vaping Use: Never used  Substance and Sexual Activity   Alcohol use: No   Drug use: No   Sexual activity: Yes

## 2021-06-07 ENCOUNTER — Ambulatory Visit: Payer: Medicare Other | Admitting: Rehabilitative and Restorative Service Providers"

## 2021-06-07 ENCOUNTER — Encounter: Payer: Self-pay | Admitting: Rehabilitative and Restorative Service Providers"

## 2021-06-07 ENCOUNTER — Ambulatory Visit
Admission: RE | Admit: 2021-06-07 | Discharge: 2021-06-07 | Disposition: A | Discharge status: Home / Self Care | Payer: Medicare Other | Source: Ambulatory Visit | Attending: Internal Medicine | Admitting: Internal Medicine

## 2021-06-07 ENCOUNTER — Other Ambulatory Visit: Payer: Self-pay

## 2021-06-07 DIAGNOSIS — R7989 Other specified abnormal findings of blood chemistry: Secondary | ICD-10-CM

## 2021-06-07 DIAGNOSIS — G8929 Other chronic pain: Secondary | ICD-10-CM

## 2021-06-07 DIAGNOSIS — R262 Difficulty in walking, not elsewhere classified: Secondary | ICD-10-CM

## 2021-06-07 DIAGNOSIS — M6281 Muscle weakness (generalized): Secondary | ICD-10-CM | POA: Diagnosis not present

## 2021-06-07 DIAGNOSIS — M25551 Pain in right hip: Secondary | ICD-10-CM

## 2021-06-07 DIAGNOSIS — R945 Abnormal results of liver function studies: Secondary | ICD-10-CM | POA: Diagnosis not present

## 2021-06-07 DIAGNOSIS — M545 Low back pain, unspecified: Secondary | ICD-10-CM | POA: Diagnosis not present

## 2021-06-07 NOTE — Patient Instructions (Signed)
Access Code: [email or web address] URL: [email or web address] Date: 06/07/2021 Prepared by: Scot Jun  Exercises Standing Lumbar Extension with Counter - 2-3 x daily - 7 x weekly - 1 sets - 5 reps Supine Figure 4 Piriformis Stretch - 2 x daily - 7 x weekly - 1 sets - 5 reps - 30 hold Supine Piriformis Stretch with Foot on Ground - 2 x daily - 7 x weekly - 1 sets - 5 reps - 30 hold Supine Lower Trunk Rotation - 2 x daily - 7 x weekly - 1 sets - 5 reps - 15 hold Supine Bridge - 2 x daily - 7 x weekly - 3 sets - 10 reps - 2 hold Clamshell - 2 x daily - 7 x weekly - 3 sets - 10 reps

## 2021-06-07 NOTE — Therapy (Signed)
[place], [place], [address] Phone: [phone]   Fax:  [phone]  Physical Therapy Evaluation  Patient Details  Name: Amanda Cole MRN: [id number] Date of Birth: [birth year] Referring Provider (PT): Garald Balding, MD   Encounter Date: 06/07/2021   PT End of Session - 06/07/21 1128     Visit Number 1    Number of Visits 20    Date for PT Re-Evaluation 08/16/21    Authorization Type [employer] Medicare $20 copay    Progress Note Due on Visit 10    PT Start Time 1145    PT Stop Time 1221    PT Time Calculation (min) 36 min    Activity Tolerance Patient tolerated treatment well    Behavior During Therapy [facility] for tasks assessed/performed             Past Medical History:  Diagnosis Date   Cataract     Past Surgical History:  Procedure Laterality Date   CATARACT EXTRACTION     B    There were no vitals filed for this visit.    Subjective Assessment - 06/07/21 1127     Subjective Pt. indicated complaints and arthritis in Rt hip and back.  Pt. indicated chief complaints can vary between back and Rt lateral hip and groin.  Pt. indicated limited medicine use due to history of kidney disease.  Pt. indicated limited in housework, walking/standing.  Pt. indicated popping in back and Rt hip.    Limitations Standing;Walking;House hold activities    How long can you sit comfortably? 30    How long can you walk comfortably? 15    Diagnostic tests xrays performed 06/01/2021 showing degenerative changes lower lumbar, bilateral hips OA    Patient Stated Goals Reduce pain    Currently in Pain? Yes    Pain Score 2    pain at worst 8/10   Pain Location Back    Pain Orientation Right;Lower    Pain Descriptors / Indicators Aching    Pain Type Chronic pain    Pain Radiating Towards Rt hip    Pain Onset More than a month ago    Pain Frequency Intermittent    Aggravating Factors  prolonged walking/standing, housework,  prolonged sitting, vacuum pushing/pulling    Pain Relieving Factors resting (short time to recover)                [facility] PT Assessment - 06/07/21 0001       Assessment   Medical Diagnosis M25.551 (ICD-10-CM) - Pain in right hip    Referring Provider (PT) Garald Balding, MD    Onset Date/Surgical Date 05/15/21   chronic onset     Precautions   Precautions None      Restrictions   Weight Bearing Restrictions No      Balance Screen   Has the patient fallen in the past 6 months No    Has the patient had a decrease in activity level because of a fear of falling?  No    Is the patient reluctant to leave their home because of a fear of falling?  No      Home [employer] residence      Prior Function   Level of Independence Independent      Cognition   Overall Cognitive Status Within Functional Limits for tasks assessed      Observation/Other Assessments   Focus on Therapeutic Outcomes (FOTO)  intake 43%, predicted 54%      Functional Tests   Functional tests Sit to Stand      Sit to Stand   Comments 18 inch chair s UE assist no complaints      Posture/Postural Control   Posture Comments Mild reduction in lumbar lordosis, weight shift away from Rt in standing      ROM / Strength   AROM / PROM / Strength Strength;PROM;AROM      AROM   Overall AROM Comments Rt hip pain limited in all measured directions.  Measured in supine positioning.  ER/IR measured in 90 deg hip flexion    AROM Assessment Site Lumbar;Hip    Right/Left Hip Left;Right    Right Hip Flexion 102   pain in groin   Right Hip External Rotation  39    Right Hip Internal Rotation  18    Left Hip Flexion 118    Left Hip External Rotation  40    Left Hip Internal Rotation  20    Lumbar Flexion movement to toes without pain noted    Lumbar Extension 75% WFL c end range stiffness reported.  x5 in standing 100 % WFL      PROM   PROM Assessment Site Hip    Right/Left Hip  Left;Right      Strength   Strength Assessment Site Hip;Knee    Right/Left Hip Left;Right    Right Hip Flexion 5/5    Right Hip ABduction 3+/5    Left Hip Flexion 5/5    Right/Left Knee Right;Left    Right Knee Flexion 5/5    Right Knee Extension 5/5    Left Knee Flexion 5/5    Left Knee Extension 5/5      Ambulation/Gait   Gait Comments Independent ambulation                        Objective measurements completed on examination: See above findings.       [facility] Adult PT Treatment/Exercise - 06/07/21 0001       Exercises   Exercises Other Exercises    Other Exercises  HEP instruction/performance c cues for techniques, handout provided.  Trial set performed of each for comprehension and symptom assessment.  HEP consisting of supine LTR stretch, piriformis figure 4 push away and pull towards 30 sec, supine bridge, sidelying clam shell, standing lumbar extension                     PT Education - 06/07/21 1128     Education Details HEP, POC    Person(s) Educated Patient    Methods Explanation;Demonstration;Verbal cues;Handout    Comprehension Verbalized understanding;Returned demonstration              PT Short Term Goals - 06/07/21 1128       PT SHORT TERM GOAL #1   Title Patient will demonstrate independent use of home exercise program to maintain progress from in clinic treatments.    Time 3    Period Weeks    Status New    Target Date 06/28/21               PT Long Term Goals - 06/07/21 1128       PT LONG TERM GOAL #1   Title Patient will demonstrate/report pain at worst less than or equal to 2/10 to facilitate minimal limitation in daily activity secondary to pain symptoms.    Time  10    Period Weeks    Status New    Target Date 08/16/21      PT LONG TERM GOAL #2   Title Patient will demonstrate independent use of home exercise program to facilitate ability to maintain/progress functional gains from skilled physical  therapy services.    Time 10    Period Weeks    Status New    Target Date 08/16/21      PT LONG TERM GOAL #3   Title Pt. will demonstrate FOTO outcome > or = 54% to indicated reduced disability due to condition.    Time 10    Period Weeks    Status New    Target Date 08/16/21      PT LONG TERM GOAL #4   Title Pt. will demonstrate lumbar ext 100 % WFL s symptoms to facilitate upright standing/walking posture at PLOF.    Time 10    Period Weeks    Status New    Target Date 08/16/21      PT LONG TERM GOAL #5   Title Pt. will demonstrate bilateral hip MMT hip abduction > or = 4/5 to facilitate stability in ambulation.    Time 10    Period Weeks    Status New    Target Date 08/16/21      Additional Long Term Goals   Additional Long Term Goals Yes      PT LONG TERM GOAL #6   Title Pt. will demonstrate/report ability to perform usual housework at PLOF s restriction.    Time 10    Period Weeks    Status New    Target Date 08/16/21                    Plan - 06/07/21 1130     Clinical Impression Statement Patient is a 81 y.o. who comes to clinic with complaints of low back pain, Rt hip pain with mobility, strength and movement coordination deficits that impair their ability to perform usual daily and recreational functional activities without increase difficulty/symptoms at this time.  Patient to benefit from skilled PT services to address impairments and limitations to improve to previous level of function without restriction secondary to condition.    Personal Factors and Comorbidities Comorbidity 3+    Comorbidities CKD, DDD lumbar, bilateral hip OA    Examination-Activity Limitations Stand;Carry;Bend;Squat;Stairs;Sit;Transfers;Locomotion Level    Examination-Participation Restrictions Cleaning;Community Activity;Interpersonal Relationship    Stability/Clinical Decision Making Stable/Uncomplicated    Clinical Decision Making Low    Rehab Potential Good    PT  Frequency Other (comment)   1-2x/week   PT Duration Other (comment)   10 weeks   PT Treatment/Interventions ADLs/Self Care Home Management;Cryotherapy;Electrical Stimulation;Iontophoresis 4mg /ml Dexamethasone;Balance training;Moist Heat;Therapeutic exercise;Traction;Therapeutic activities;Functional mobility training;Stair training;Gait training;DME Instruction;Ultrasound;Neuromuscular re-education;Passive range of motion;Patient/family education;Spinal Manipulations;Dry needling;Joint Manipulations;Taping;Manual techniques    PT Next Visit Plan Rt hip mobilizations possible, lumbar/hip mobility gains.    PT Home Exercise Plan [email or web address]    Consulted and Agree with Plan of Care Patient             Patient will benefit from skilled therapeutic intervention in order to improve the following deficits and impairments:  Hypomobility, Pain, Decreased strength, Decreased activity tolerance, Decreased mobility, Decreased balance, Difficulty walking, Impaired perceived functional ability, Improper body mechanics, Impaired flexibility, Decreased coordination, Decreased range of motion  Visit Diagnosis: Chronic bilateral low back pain, unspecified whether sciatica present  Pain in right hip  Muscle weakness (generalized)  Difficulty in walking, not elsewhere classified     Problem List Patient Active Problem List   Diagnosis Date Noted   Low back pain 06/01/2021   Atrophic vaginitis 06/29/2020   Elevated liver enzymes 08/27/2019   Chronic renal insufficiency, stage 3 (moderate) ([place]) 03/17/2019   Sleep disorder 06/26/2018   Bilateral hand pain- chronic in DIP and PIP jts 06/26/2018   Family history of rheumatoid arthritis 06/26/2018   Dysuria 05/16/2016   Seborrheic dermatitis 06/22/2015   Clavicle enlargement 05/22/2014   Well adult exam 02/29/2012   Hand pain, left 02/29/2012   LOW BACK PAIN, ACUTE 03/07/2010   Vitamin B12 deficiency 02/01/2009   PALPITATIONS 02/01/2009   CYSTITIS  05/29/2008   HIP PAIN 05/29/2008   HAND PAIN, LEFT 05/29/2008   HEADACHE 01/10/2008   RASH-NONVESICULAR 11/01/2007   HERPES ZOSTER OPHTHALMICUS 10/12/2007   HEARING IMPAIRMENT 10/03/2007   ALLERGIC RHINITIS 10/03/2007   Chronic inflammatory arthritis 10/03/2007   Dyslipidemia 05/14/2007   SINUSITIS- ACUTE-NOS 05/14/2007   Scot Jun, PT, DPT, OCS, ATC 06/07/21  12:51 PM    [facility] Physical Therapy [address] [place], [place], [address] Phone: [phone]   Fax:  [phone]  Name: Amanda Cole MRN: [id number] Date of Birth: 31-Mar-1941

## 2021-06-13 ENCOUNTER — Other Ambulatory Visit: Payer: Self-pay

## 2021-06-13 ENCOUNTER — Encounter: Payer: Self-pay | Admitting: Rehabilitative and Restorative Service Providers"

## 2021-06-13 ENCOUNTER — Ambulatory Visit: Payer: Medicare Other | Admitting: Rehabilitative and Restorative Service Providers"

## 2021-06-13 DIAGNOSIS — M6281 Muscle weakness (generalized): Secondary | ICD-10-CM

## 2021-06-13 DIAGNOSIS — R262 Difficulty in walking, not elsewhere classified: Secondary | ICD-10-CM | POA: Diagnosis not present

## 2021-06-13 DIAGNOSIS — M25551 Pain in right hip: Secondary | ICD-10-CM | POA: Diagnosis not present

## 2021-06-13 DIAGNOSIS — M545 Low back pain, unspecified: Secondary | ICD-10-CM | POA: Diagnosis not present

## 2021-06-13 DIAGNOSIS — G8929 Other chronic pain: Secondary | ICD-10-CM

## 2021-06-13 NOTE — Therapy (Signed)
[place], [place], [address] Phone: [phone]   Fax:  [phone]  Physical Therapy Treatment  Patient Details  Name: Amanda Cole MRN: [id number] Date of Birth: 07-17-1940 Referring Provider (PT): Garald Balding, MD   Encounter Date: 06/13/2021   PT End of Session - 06/13/21 1259     Visit Number 2    Number of Visits 20    Date for PT Re-Evaluation 08/16/21    Authorization Type [employer] Medicare $20 copay    Progress Note Due on Visit 10    PT Start Time 1259    PT Stop Time 1339    PT Time Calculation (min) 40 min    Activity Tolerance Patient tolerated treatment well    Behavior During Therapy [facility] for tasks assessed/performed             Past Medical History:  Diagnosis Date   Cataract     Past Surgical History:  Procedure Laterality Date   CATARACT EXTRACTION     B    There were no vitals filed for this visit.   Subjective Assessment - 06/13/21 1302     Subjective Pt. indicated feeling stinging in hip around 3/10 that is constant.  No worse since last visit.    Limitations Standing;Walking;House hold activities    Diagnostic tests xrays performed 06/01/2021 showing degenerative changes lower lumbar, bilateral hips OA    Patient Stated Goals Reduce pain    Currently in Pain? Yes    Pain Score 3     Pain Location Hip    Pain Orientation Right;Lower    Pain Descriptors / Indicators Other (Comment)   stinging   Pain Onset More than a month ago    Pain Frequency Constant    Aggravating Factors  constant with walking                [facility] PT Assessment - 06/13/21 0001       AROM   Right Hip Flexion 125    Right Hip External Rotation  40    Right Hip Internal Rotation  30    Lumbar Extension 100 % WFL                           [facility] Adult PT Treatment/Exercise - 06/13/21 0001       Exercises   Exercises Knee/Hip      Knee/Hip Exercises: Stretches   Other  Knee/Hip Stretches figure 4 30 sec x 2 bilateral push away, pull towards each      Knee/Hip Exercises: Seated   Sit to Sand 10 reps;without UE support   slow eccentric lowering     Knee/Hip Exercises: Supine   Bridges 20 reps      Manual Therapy   Manual therapy comments STM to inferior Rt lumbar paraspinals, superior glute max.  G3 inferior/lateral Rt hip joint mobilizations, AP through shaft of femur in mild hip adduction.  Lt sidelying g4 regional rotation lumbar mobilization                       PT Short Term Goals - 06/13/21 1324       PT SHORT TERM GOAL #1   Title Patient will demonstrate independent use of home exercise program to maintain progress from in clinic treatments.    Time 3    Period Weeks    Status On-going    Target  Date 06/28/21               PT Long Term Goals - 06/07/21 1128       PT LONG TERM GOAL #1   Title Patient will demonstrate/report pain at worst less than or equal to 2/10 to facilitate minimal limitation in daily activity secondary to pain symptoms.    Time 10    Period Weeks    Status New    Target Date 08/16/21      PT LONG TERM GOAL #2   Title Patient will demonstrate independent use of home exercise program to facilitate ability to maintain/progress functional gains from skilled physical therapy services.    Time 10    Period Weeks    Status New    Target Date 08/16/21      PT LONG TERM GOAL #3   Title Pt. will demonstrate FOTO outcome > or = 54% to indicated reduced disability due to condition.    Time 10    Period Weeks    Status New    Target Date 08/16/21      PT LONG TERM GOAL #4   Title Pt. will demonstrate lumbar ext 100 % WFL s symptoms to facilitate upright standing/walking posture at PLOF.    Time 10    Period Weeks    Status New    Target Date 08/16/21      PT LONG TERM GOAL #5   Title Pt. will demonstrate bilateral hip MMT hip abduction > or = 4/5 to facilitate stability in ambulation.    Time  10    Period Weeks    Status New    Target Date 08/16/21      Additional Long Term Goals   Additional Long Term Goals Yes      PT LONG TERM GOAL #6   Title Pt. will demonstrate/report ability to perform usual housework at PLOF s restriction.    Time 10    Period Weeks    Status New    Target Date 08/16/21                   Plan - 06/13/21 1322     Clinical Impression Statement Vocalized symptoms primarily noted on posterior Rt hip near PSIS region.  Concordant symptoms noted c posterior Rt hip joint passive accessory movement assessment.  No concordant symptoms noted with PAIVM assessment in lumbar region.  Restriction in lumbar/Rt hip joint mobility to impair functional movement pattern.  Continued skilled PT services indicated.    Personal Factors and Comorbidities Comorbidity 3+    Comorbidities CKD, DDD lumbar, bilateral hip OA    Examination-Activity Limitations Stand;Carry;Bend;Squat;Stairs;Sit;Transfers;Locomotion Level    Examination-Participation Restrictions Cleaning;Community Activity;Interpersonal Relationship    Stability/Clinical Decision Making Stable/Uncomplicated    Rehab Potential Good    PT Frequency Other (comment)   1-2x/week   PT Duration Other (comment)   10 weeks   PT Treatment/Interventions ADLs/Self Care Home Management;Cryotherapy;Electrical Stimulation;Iontophoresis 4mg /ml Dexamethasone;Balance training;Moist Heat;Therapeutic exercise;Traction;Therapeutic activities;Functional mobility training;Stair training;Gait training;DME Instruction;Ultrasound;Neuromuscular re-education;Passive range of motion;Patient/family education;Spinal Manipulations;Dry needling;Joint Manipulations;Taping;Manual techniques    PT Next Visit Plan Reassess Rt hip joint mobility intervention benefit.    PT Home Exercise Plan [id number]    Consulted and Agree with Plan of Care Patient             Patient will benefit from skilled therapeutic intervention in order to  improve the following deficits and impairments:  Hypomobility, Pain, Decreased strength, Decreased activity tolerance, Decreased  mobility, Decreased balance, Difficulty walking, Impaired perceived functional ability, Improper body mechanics, Impaired flexibility, Decreased coordination, Decreased range of motion  Visit Diagnosis: Chronic bilateral low back pain, unspecified whether sciatica present  Pain in right hip  Muscle weakness (generalized)  Difficulty in walking, not elsewhere classified     Problem List Patient Active Problem List   Diagnosis Date Noted   Low back pain 06/01/2021   Atrophic vaginitis 06/29/2020   Elevated liver enzymes 08/27/2019   Chronic renal insufficiency, stage 3 (moderate) ([place]) 03/17/2019   Sleep disorder 06/26/2018   Bilateral hand pain- chronic in DIP and PIP jts 06/26/2018   Family history of rheumatoid arthritis 06/26/2018   Dysuria 05/16/2016   Seborrheic dermatitis 06/22/2015   Clavicle enlargement 05/22/2014   Well adult exam 02/29/2012   Hand pain, left 02/29/2012   LOW BACK PAIN, ACUTE 03/07/2010   Vitamin B12 deficiency 02/01/2009   PALPITATIONS 02/01/2009   CYSTITIS 05/29/2008   HIP PAIN 05/29/2008   HAND PAIN, LEFT 05/29/2008   HEADACHE 01/10/2008   RASH-NONVESICULAR 11/01/2007   HERPES ZOSTER OPHTHALMICUS 10/12/2007   HEARING IMPAIRMENT 10/03/2007   ALLERGIC RHINITIS 10/03/2007   Chronic inflammatory arthritis 10/03/2007   Dyslipidemia 05/14/2007   SINUSITIS- ACUTE-NOS 05/14/2007   Scot Jun, PT, DPT, OCS, ATC 06/13/21  1:38 PM    [facility] [address] [place], [place], [address] Phone: [phone]   Fax:  [phone]  Name: Amanda Cole MRN: [id number] Date of Birth: [birth year]

## 2021-06-16 ENCOUNTER — Ambulatory Visit: Payer: Self-pay

## 2021-06-16 ENCOUNTER — Ambulatory Visit: Payer: Medicare Other | Admitting: Physical Medicine and Rehabilitation

## 2021-06-16 ENCOUNTER — Encounter: Payer: Self-pay | Admitting: Physical Medicine and Rehabilitation

## 2021-06-16 ENCOUNTER — Other Ambulatory Visit: Payer: Self-pay

## 2021-06-16 DIAGNOSIS — M25551 Pain in right hip: Secondary | ICD-10-CM

## 2021-06-16 MED ORDER — BUPIVACAINE HCL 0.25 % IJ SOLN
4.0000 mL | INTRAMUSCULAR | Status: AC | PRN
  Administered 2021-06-16: 4 mL via INTRA_ARTICULAR

## 2021-06-16 MED ORDER — TRIAMCINOLONE ACETONIDE 40 MG/ML IJ SUSP
60.0000 mg | INTRAMUSCULAR | Status: AC | PRN
  Administered 2021-06-16: 60 mg via INTRA_ARTICULAR

## 2021-06-16 NOTE — Progress Notes (Signed)
Pt state right hip pain. Pt state walking and standing makes the pain worse. Pt state she goes to PT and excise to help ease her pain.  Numeric Pain Rating Scale and Functional Assessment Average Pain 3   In the last MONTH (on 0-10 scale) has pain interfered with the following?  1. General activity like being  able to carry out your everyday physical activities such as walking, climbing stairs, carrying groceries, or moving a chair?  Rating(7)   -BT, -Dye Allergies.

## 2021-06-16 NOTE — Progress Notes (Signed)
° °  Amanda Cole - 81 y.o. female MRN [id number]  Date of birth: 03-15-1941  Office Visit Note: Visit Date: 06/16/2021 PCP: Cassandria Anger, MD Referred by: Cassandria Anger, MD  Subjective: Chief Complaint  Patient presents with   Right Hip - Pain   HPI:  Amanda Cole is a 81 y.o. female who comes in today at the request of Dr. Joni Fears for planned Right anesthetic hip arthrogram with fluoroscopic guidance.  The patient has failed conservative care including home exercise, medications, time and activity modification.  This injection will be diagnostic and hopefully therapeutic.  Please see requesting physician notes for further details and justification.   ROS Otherwise per HPI.  Assessment & Plan: Visit Diagnoses:    ICD-10-CM   1. Pain in right hip  M25.551 Large Joint Inj: R hip joint    XR C-ARM NO REPORT      Plan: No additional findings.   Meds & Orders: No orders of the defined types were placed in this encounter.   Orders Placed This Encounter  Procedures   Large Joint Inj: R hip joint   XR C-ARM NO REPORT    Follow-up: Return for visit to requesting provider as needed.   Procedures: Large Joint Inj: R hip joint on 06/16/2021 10:23 AM Indications: diagnostic evaluation and pain Details: 22 G 3.5 in needle, fluoroscopy-guided anterior approach  Arthrogram: No  Medications: 4 mL bupivacaine 0.25 %; 60 mg triamcinolone acetonide 40 MG/ML Outcome: tolerated well, no immediate complications  There was excellent flow of contrast producing a partial arthrogram of the hip. The patient did NOT have relief of symptoms during the anesthetic phase of the injection. Procedure, treatment alternatives, risks and benefits explained, specific risks discussed. Consent was given by the patient. Immediately prior to procedure a time out was called to verify the correct patient, procedure, equipment, support staff and site/side marked as required. Patient was  prepped and draped in the usual sterile fashion.         Clinical History: No specialty comments available.     Objective:  VS:  HT:     WT:    BMI:      BP:    HR: bpm   TEMP: ( )   RESP:  Physical Exam   Imaging: No results found.

## 2021-06-17 ENCOUNTER — Encounter: Payer: Medicare Other | Admitting: Rehabilitative and Restorative Service Providers"

## 2021-06-20 ENCOUNTER — Encounter: Payer: Medicare Other | Admitting: Rehabilitative and Restorative Service Providers"

## 2021-06-23 ENCOUNTER — Encounter: Payer: Self-pay | Admitting: Internal Medicine

## 2021-06-23 ENCOUNTER — Other Ambulatory Visit: Payer: Self-pay

## 2021-06-23 ENCOUNTER — Ambulatory Visit (INDEPENDENT_AMBULATORY_CARE_PROVIDER_SITE_OTHER): Payer: Medicare Other | Admitting: Internal Medicine

## 2021-06-23 ENCOUNTER — Encounter: Payer: Medicare Other | Admitting: Rehabilitative and Restorative Service Providers"

## 2021-06-23 DIAGNOSIS — N2889 Other specified disorders of kidney and ureter: Secondary | ICD-10-CM

## 2021-06-23 DIAGNOSIS — N183 Chronic kidney disease, stage 3 unspecified: Secondary | ICD-10-CM | POA: Diagnosis not present

## 2021-06-23 DIAGNOSIS — E538 Deficiency of other specified B group vitamins: Secondary | ICD-10-CM

## 2021-06-23 DIAGNOSIS — R748 Abnormal levels of other serum enzymes: Secondary | ICD-10-CM | POA: Diagnosis not present

## 2021-06-23 DIAGNOSIS — M25551 Pain in right hip: Secondary | ICD-10-CM

## 2021-06-23 DIAGNOSIS — M1380 Other specified arthritis, unspecified site: Secondary | ICD-10-CM

## 2021-06-23 DIAGNOSIS — M1990 Unspecified osteoarthritis, unspecified site: Secondary | ICD-10-CM

## 2021-06-23 LAB — URINALYSIS
Bilirubin Urine: NEGATIVE
Hgb urine dipstick: NEGATIVE
Ketones, ur: NEGATIVE
Leukocytes,Ua: NEGATIVE
Nitrite: NEGATIVE
Specific Gravity, Urine: 1.02 (ref 1.000–1.030)
Total Protein, Urine: NEGATIVE
Urine Glucose: NEGATIVE
Urobilinogen, UA: 0.2 (ref 0.0–1.0)
pH: 5.5 (ref 5.0–8.0)

## 2021-06-23 NOTE — Assessment & Plan Note (Signed)
On a MVI

## 2021-06-23 NOTE — Assessment & Plan Note (Addendum)
Stable GFR 52-53 -- Stage 3a Cont to hydrate well

## 2021-06-23 NOTE — Progress Notes (Signed)
Subjective:  Patient ID: Amanda Cole, female    DOB: [birth year]  Age: 81 y.o. MRN: [id number]  CC: No chief complaint on file.   HPI [name] presents for OA -R hip - seeing Ortho and had a hip injection 10 d ago; better  Outpatient Medications Prior to Visit  Medication Sig Dispense Refill   Cholecalciferol (VITAMIN D3) 50 MCG (2000 UT) capsule Take 1 capsule (2,000 Units total) by mouth daily. 100 capsule 3   conjugated estrogens (PREMARIN) vaginal cream Use PV as directed 42.5 g 3   Multiple Vitamins-Minerals (ONE-A-DAY 50 PLUS PO) Take 1 tablet by mouth daily.     B Complex Vitamins (VITAMIN B COMPLEX) TABS Take 1 tablet by mouth daily. Take 1 tablet by mouth daily     No facility-administered medications prior to visit.    ROS: Review of Systems  Constitutional:  Negative for activity change, appetite change, chills, fatigue and unexpected weight change.  HENT:  Negative for congestion, mouth sores and sinus pressure.   Eyes:  Negative for visual disturbance.  Respiratory:  Negative for cough and chest tightness.   Gastrointestinal:  Negative for abdominal pain and nausea.  Genitourinary:  Negative for difficulty urinating, frequency and vaginal pain.  Musculoskeletal:  Positive for arthralgias. Negative for back pain and gait problem.  Skin:  Negative for pallor and rash.  Neurological:  Negative for dizziness, tremors, weakness, numbness and headaches.  Psychiatric/Behavioral:  Negative for confusion, sleep disturbance and suicidal ideas.    Objective:  BP 120/70 (BP Location: Left Arm, Patient Position: Sitting, Cuff Size: Large)    Pulse 78    Temp 97.9 F (36.6 C) (Oral)    Ht 5' 3.5" (1.613 m)    Wt 123 lb (55.8 kg)    SpO2 98%    BMI 21.45 kg/m   BP Readings from Last 3 Encounters:  06/23/21 120/70  12/30/20 122/82  06/29/20 112/72    Wt Readings from Last 3 Encounters:  06/23/21 123 lb (55.8 kg)  06/01/21 125 lb (56.7 kg)  12/30/20 124 lb 12.8  oz (56.6 kg)    Physical Exam Constitutional:      General: She is not in acute distress.    Appearance: Normal appearance. She is well-developed.  HENT:     Head: Normocephalic.     Right Ear: External ear normal.     Left Ear: External ear normal.     Nose: Nose normal.  Eyes:     General:        Right eye: No discharge.        Left eye: No discharge.     Conjunctiva/sclera: Conjunctivae normal.     Pupils: Pupils are equal, round, and reactive to light.  Neck:     Thyroid: No thyromegaly.     Vascular: No JVD.     Trachea: No tracheal deviation.  Cardiovascular:     Rate and Rhythm: Normal rate and regular rhythm.     Heart sounds: Normal heart sounds.  Pulmonary:     Effort: No respiratory distress.     Breath sounds: No stridor. No wheezing.  Abdominal:     General: Bowel sounds are normal. There is no distension.     Palpations: Abdomen is soft. There is no mass.     Tenderness: There is no abdominal tenderness. There is no guarding or rebound.  Musculoskeletal:        General: Tenderness present.     Cervical back:  Normal range of motion and neck supple. No rigidity.  Lymphadenopathy:     Cervical: No cervical adenopathy.  Skin:    Findings: No erythema or rash.  Neurological:     Mental Status: She is oriented to person, place, and time.     Cranial Nerves: No cranial nerve deficit.     Motor: No abnormal muscle tone.     Coordination: Coordination normal.     Deep Tendon Reflexes: Reflexes normal.  Psychiatric:        Behavior: Behavior normal.        Thought Content: Thought content normal.        Judgment: Judgment normal.   Hands w/OA R hip is better  Lab Results  Component Value Date   WBC 4.5 12/30/2020   HGB 13.2 12/30/2020   HCT 40.3 12/30/2020   PLT 197.0 12/30/2020   GLUCOSE 81 06/29/2020   CHOL 256 (H) 12/30/2020   TRIG 140.0 12/30/2020   HDL 58.00 12/30/2020   LDLDIRECT 161.5 03/29/2012   LDLCALC 170 (H) 12/30/2020   ALT 57 (H)  06/29/2020   AST 40 (H) 06/29/2020   NA 137 06/29/2020   K 4.2 06/29/2020   CL 103 06/29/2020   CREATININE 1.01 06/29/2020   BUN 18 06/29/2020   CO2 28 06/29/2020   TSH 2.81 12/30/2020   MICROALBUR <0.7 06/29/2020    US Abdomen Limited RUQ (LIVER/GB)  Result Date: 06/07/2021 CLINICAL DATA:  Elevated liver function tests. EXAM: ULTRASOUND ABDOMEN LIMITED RIGHT UPPER QUADRANT COMPARISON:  November 28, 2019 FINDINGS: Gallbladder: No gallstones or wall thickening visualized (2.0 mm). No sonographic Murphy sign noted by sonographer. Common bile duct: Diameter: 4.2 mm Liver: No focal lesion identified. The liver parenchyma is heterogeneous in appearance and nodular in contour. Mild intrahepatic biliary dilatation is noted. Portal vein is patent on color Doppler imaging with normal direction of blood flow towards the liver. Other: None. IMPRESSION: Heterogeneous, nodular echotexture of the liver and mild intrahepatic biliary dilatation, without focal liver lesions. Electronically Signed   By: Virgina Norfolk M.D.   On: 06/07/2021 20:44    Assessment & Plan:   Problem List Items Addressed This Visit     Chronic inflammatory arthritis    On topical treatments      Chronic renal insufficiency, stage 3 (moderate) ([facility])    Stable GFR 52-53 -- Stage 3a Cont to hydrate well      Elevated liver enzymes    Check LFTs Better      HIP PAIN    R hip - seeing Ortho and had a hip injection 10 d ago; better      Vitamin B12 deficiency    On a MVI         No orders of the defined types were placed in this encounter.     Follow-up: No follow-ups on file.  Walker Kehr, MD

## 2021-06-23 NOTE — Assessment & Plan Note (Signed)
On topical treatments

## 2021-06-23 NOTE — Assessment & Plan Note (Addendum)
Check LFTs Better

## 2021-06-23 NOTE — Assessment & Plan Note (Addendum)
R hip - seeing Ortho and had a hip injection 10 d ago; better

## 2021-06-27 ENCOUNTER — Encounter: Payer: Medicare Other | Admitting: Rehabilitative and Restorative Service Providers"

## 2021-06-30 ENCOUNTER — Encounter: Payer: Medicare Other | Admitting: Rehabilitative and Restorative Service Providers"

## 2021-07-14 DIAGNOSIS — R7989 Other specified abnormal findings of blood chemistry: Secondary | ICD-10-CM | POA: Diagnosis not present

## 2021-07-19 ENCOUNTER — Telehealth: Payer: Self-pay | Admitting: Internal Medicine

## 2021-07-19 NOTE — Telephone Encounter (Signed)
LVM for pt to rtn my call to schedule AWV with [facility]. Please schedule if pt calls the office.  ?

## 2021-08-09 ENCOUNTER — Encounter: Payer: Self-pay | Admitting: Family Medicine

## 2021-08-09 ENCOUNTER — Ambulatory Visit (INDEPENDENT_AMBULATORY_CARE_PROVIDER_SITE_OTHER): Payer: Medicare Other | Admitting: Family Medicine

## 2021-08-09 VITALS — BP 122/68 | HR 65 | Temp 97.9°F | Ht 63.5 in | Wt 123.6 lb

## 2021-08-09 DIAGNOSIS — R300 Dysuria: Secondary | ICD-10-CM | POA: Diagnosis not present

## 2021-08-09 DIAGNOSIS — N3000 Acute cystitis without hematuria: Secondary | ICD-10-CM

## 2021-08-09 DIAGNOSIS — R350 Frequency of micturition: Secondary | ICD-10-CM

## 2021-08-09 DIAGNOSIS — R102 Pelvic and perineal pain: Secondary | ICD-10-CM | POA: Diagnosis not present

## 2021-08-09 LAB — POCT URINALYSIS DIPSTICK
Bilirubin, UA: NEGATIVE
Blood, UA: NEGATIVE
Glucose, UA: NEGATIVE
Ketones, UA: NEGATIVE
Nitrite, UA: NEGATIVE
Protein, UA: NEGATIVE
Spec Grav, UA: >=1.03 — AB (ref 1.010–1.025)
Urobilinogen, UA: NEGATIVE E.U./dL — AB
pH, UA: 6 (ref 5.0–8.0)

## 2021-08-09 MED ORDER — SULFAMETHOXAZOLE-TRIMETHOPRIM 800-160 MG PO TABS: 1.0000 | ORAL_TABLET | Freq: Two times a day (BID) | ORAL | 0 refills | Status: DC

## 2021-08-09 NOTE — Patient Instructions (Signed)
Stay well hydrated (water) ? ?Use the Premarin 2 times per week until symptoms resolve ? ?You may try over the counter AZO for 1-2 days.  ? ?Start the antibiotic.  ? ?You will hear from [place] with your urine culture.  ?

## 2021-08-09 NOTE — Progress Notes (Deleted)
Subjective: ?Chief Complaint  ?Patient presents with  ? Urinary Tract Infection  ?  Pt believes she has a UTI. Has been going on and off for several weeks. Sx include bladder pressure and burning feeling ? ?Has used neosporin and pt believed it helped some  ? ? ? Amanda Cole is a 81 y.o. female who complains of possible urinary tract infection.  She has had symptoms for 2 weeks.  Symptoms include  intermittent   lower abdominal pressure, burning, urinary frequency, and a bad odor . States she does not feel like she empties her bladder well.  ?Patient denies  fever, dizziness, chest pain, palpitations, shortness of breath, N/V/D or constipation. No vaginal discharge .  Last UTI was years.   Using topical neosporin for current symptoms.  She has Premarin vaginal cream at home but has not been using it. No sexual activity.  ? ?She leaks some urine but this is not new and not worsening.  ? ?Hx of hysterectomy with bladder tac in 1980s.  ? ?Patient does not have a history of recurrent UTI. Patient does not have a history of pyelonephritis.  No other aggravating or relieving factors.  No other c/o. ? ? ?Past Medical History:  ?Diagnosis Date  ? Cataract   ? ? ?ROS as in subjective ? ?Reviewed allergies, medications, past medical, surgical, and social history.  ? ? ?Objective: ?Vitals:  ? 08/09/21 0824  ?BP: 122/68  ?Pulse: 65  ?Temp: 97.9 ?F (36.6 ?C)  ?SpO2: 99%  ? ? ?General appearance: alert, no distress, WD/WN, female ?Abdomen: +bs, soft, non distended, tenderness suprapubic area without rebound or guarding, no palpable masses  ?Back: no CVA tenderness ?GU: deferred  ?   ? ?Laboratory:  ?Urine dipstick: trace for leukocyte esterase.   ?  ? ? ?Assessment: ?Acute cystitis without hematuria - Plan: CULTURE, URINE COMPREHENSIVE, sulfamethoxazole-trimethoprim (BACTRIM DS) 800-160 MG tablet ? ?Frequent urination - Plan: POCT urinalysis dipstick ? ?Dysuria ? ?Suprapubic pressure ? ? ?Plan: ?Discussed symptoms, diagnosis,  possible complications, and usual course of illness. ? ?Begin medication Bactrim (allergy to Macrobid and Cipro per patient).  Advised increased water intake, can use OTC Tylenol for pain.    Advised to start using her Premarin 2 x per week for the next couple of weeks.   ? ?She may try AZO for 1-2 days and then stop.  ? ?Urine culture sent.  ? ?Call or return if worse or not improving.   ?  ? ?

## 2021-08-09 NOTE — Progress Notes (Signed)
Subjective: ?Chief Complaint  ?Patient presents with  ? Urinary Tract Infection  ?  Pt believes she has a UTI. Has been going on and off for several weeks. Sx include bladder pressure and burning feeling ? ?Has used neosporin and pt believed it helped some  ? ? ? Amanda Cole is a 81 y.o. female who complains of possible urinary tract infection.  She has had symptoms for 2 weeks.  Symptoms include  intermittent   lower abdominal pressure, burning, urinary frequency, and a bad odor . States she does not feel like she empties her bladder well.  ?Patient denies  fever, dizziness, chest pain, palpitations, shortness of breath, N/V/D or constipation. No vaginal discharge .  Last UTI was years.   Using topical neosporin for current symptoms.  She has Premarin vaginal cream at home but has not been using it. No sexual activity.  ? ?She leaks some urine but this is not new and not worsening.  ? ?Hx of hysterectomy with bladder tac in 1980s.  ? ?Patient does not have a history of recurrent UTI. Patient does not have a history of pyelonephritis.  No other aggravating or relieving factors.  No other c/o. ? ? ?Past Medical History:  ?Diagnosis Date  ? Cataract   ? ? ?ROS as in subjective ? ?Reviewed allergies, medications, past medical, surgical, and social history.  ? ? ?Objective: ?Vitals:  ? 08/09/21 0824  ?BP: 122/68  ?Pulse: 65  ?Temp: 97.9 ?F (36.6 ?C)  ?SpO2: 99%  ? ? ?General appearance: alert, no distress, WD/WN, female ?Abdomen: +bs, soft, non distended, tenderness suprapubic area without rebound or guarding, no palpable masses  ?Back: no CVA tenderness ?GU: deferred  ?   ? ?Laboratory:  ?Urine dipstick: trace for leukocyte esterase.   ?  ? ? ?Assessment: ?Acute cystitis without hematuria - Plan: CULTURE, URINE COMPREHENSIVE, sulfamethoxazole-trimethoprim (BACTRIM DS) 800-160 MG tablet ? ?Frequent urination - Plan: POCT urinalysis dipstick ? ?Dysuria ? ?Suprapubic pressure ? ? ?Plan: ?Discussed symptoms, diagnosis,  possible complications, and usual course of illness. ? ?Begin medication Bactrim (allergy to Macrobid and Cipro per patient).  Advised increased water intake, can use OTC Tylenol for pain.    Advised to start using her Premarin 2 x per week for the next couple of weeks.   ? ?She may try AZO for 1-2 days and then stop.  ? ?Urine culture sent.  ? ?Call or return if worse or not improving.   ?  ? ?

## 2021-08-12 LAB — CULTURE, URINE COMPREHENSIVE

## 2021-08-16 ENCOUNTER — Telehealth: Payer: Self-pay | Admitting: Internal Medicine

## 2021-08-16 ENCOUNTER — Other Ambulatory Visit: Payer: Self-pay | Admitting: Family Medicine

## 2021-08-16 DIAGNOSIS — N3000 Acute cystitis without hematuria: Secondary | ICD-10-CM

## 2021-08-16 MED ORDER — SULFAMETHOXAZOLE-TRIMETHOPRIM 800-160 MG PO TABS: 1.0000 | ORAL_TABLET | Freq: Two times a day (BID) | ORAL | 0 refills | Status: DC

## 2021-08-16 NOTE — Telephone Encounter (Signed)
Called pt to let her know the Rx was refilled and if she continues to experience symptoms to schedule an OV. Pt verbalized understanding.  ?

## 2021-08-16 NOTE — Telephone Encounter (Signed)
Pt states she completed the medication prescribed by [facility] and is still experiencing some urinary tract discomfort  ? ?Pt inquiring if provider can prescribe another round of sulfamethoxazole-trimethoprim (BACTRIM DS) 800-160 MG tablet ? ?Pt denied any other symptoms  ?

## 2021-08-16 NOTE — Telephone Encounter (Signed)
Pt is requesting a refill of the Bactrim DS that was prescribed for her UTI on 3/28 as she is still experiencing urinary tract discomfort.  ?

## 2021-09-22 DIAGNOSIS — R7989 Other specified abnormal findings of blood chemistry: Secondary | ICD-10-CM | POA: Diagnosis not present

## 2021-11-08 ENCOUNTER — Telehealth: Payer: Self-pay | Admitting: Internal Medicine

## 2021-11-16 ENCOUNTER — Telehealth: Payer: Self-pay | Admitting: Physical Medicine and Rehabilitation

## 2021-11-16 NOTE — Telephone Encounter (Signed)
Patient called she would like an appointment with Dr. Ernestina Patches. Her call back number is [phone]

## 2021-12-12 ENCOUNTER — Telehealth: Payer: Self-pay | Admitting: Orthopaedic Surgery

## 2021-12-12 NOTE — Telephone Encounter (Signed)
Patient called. She would like to discuss hip surgery with Dr. Durward Fortes. Her call back number is [phone]

## 2021-12-13 NOTE — Telephone Encounter (Signed)
Please make an appointment for pt to see Dr Ninfa Linden for a THR

## 2021-12-15 ENCOUNTER — Ambulatory Visit: Payer: Self-pay

## 2021-12-15 ENCOUNTER — Encounter: Payer: Self-pay | Admitting: Physical Medicine and Rehabilitation

## 2021-12-15 ENCOUNTER — Ambulatory Visit: Payer: Medicare Other | Admitting: Physical Medicine and Rehabilitation

## 2021-12-15 DIAGNOSIS — M25551 Pain in right hip: Secondary | ICD-10-CM

## 2021-12-15 NOTE — Progress Notes (Signed)
   Amanda Cole - 81 y.o. female MRN [id number]  Date of birth: 05-30-1940  Office Visit Note: Visit Date: 12/15/2021 PCP: Cassandria Anger, MD Referred by: Cassandria Anger, MD  Subjective: Chief Complaint  Patient presents with   Right Hip - Pain   HPI:  Amanda Cole is a 81 y.o. female who comes in today for planned repeat Right anesthetic hip arthrogram with fluoroscopic guidance.  The patient has failed conservative care including home exercise, medications, time and activity modification. Prior injection gave more than 50% relief for several months. This injection will be diagnostic and hopefully therapeutic.  Please see requesting physician notes for further details and justification.  Referring: Dr. Joni Fears   ROS Otherwise per HPI.  Assessment & Plan: Visit Diagnoses:    ICD-10-CM   1. Pain in right hip  M25.551 XR C-ARM NO REPORT    Large Joint Inj: R hip joint      Plan: No additional findings.   Meds & Orders: No orders of the defined types were placed in this encounter.   Orders Placed This Encounter  Procedures   Large Joint Inj: R hip joint   XR C-ARM NO REPORT    Follow-up: Return for visit to requesting provider as needed.   Procedures: Large Joint Inj: R hip joint on 12/15/2021 9:00 AM Indications: diagnostic evaluation and pain Details: 22 G 3.5 in needle, fluoroscopy-guided anterior approach  Arthrogram: No  Medications: 4 mL bupivacaine 0.25 %; 60 mg triamcinolone acetonide 40 MG/ML Outcome: tolerated well, no immediate complications  There was excellent flow of contrast producing a partial arthrogram of the hip. The patient did have relief of symptoms during the anesthetic phase of the injection. Procedure, treatment alternatives, risks and benefits explained, specific risks discussed. Consent was given by the patient. Immediately prior to procedure a time out was called to verify the correct patient, procedure, equipment,  support staff and site/side marked as required. Patient was prepped and draped in the usual sterile fashion.          Clinical History: No specialty comments available.     Objective:  VS:  HT:    WT:   BMI:     BP:   HR: bpm  TEMP: ( )  RESP:  Physical Exam   Imaging: No results found.

## 2021-12-15 NOTE — Progress Notes (Signed)
Pt state right hip pain. Pt state walking and standing makes the pain worse. Pt state she goes to PT and excise to help ease her pain. Pt state she take [place] meds to help ease her pain.  Numeric Pain Rating Scale and Functional Assessment Average Pain 8   In the last MONTH (on 0-10 scale) has pain interfered with the following?  1. General activity like being  able to carry out your everyday physical activities such as walking, climbing stairs, carrying groceries, or moving a chair?  Rating(10)   \-BT, -Dye Allergies.

## 2021-12-21 ENCOUNTER — Ambulatory Visit: Payer: Medicare Other | Admitting: Internal Medicine

## 2021-12-22 ENCOUNTER — Ambulatory Visit (INDEPENDENT_AMBULATORY_CARE_PROVIDER_SITE_OTHER): Payer: Medicare Other | Admitting: Internal Medicine

## 2021-12-22 ENCOUNTER — Encounter: Payer: Self-pay | Admitting: Internal Medicine

## 2021-12-22 VITALS — BP 118/68 | HR 58 | Temp 97.9°F | Ht 63.5 in | Wt 125.0 lb

## 2021-12-22 DIAGNOSIS — M25551 Pain in right hip: Secondary | ICD-10-CM | POA: Diagnosis not present

## 2021-12-22 DIAGNOSIS — E538 Deficiency of other specified B group vitamins: Secondary | ICD-10-CM | POA: Diagnosis not present

## 2021-12-22 DIAGNOSIS — M1990 Unspecified osteoarthritis, unspecified site: Secondary | ICD-10-CM

## 2021-12-22 DIAGNOSIS — N183 Chronic kidney disease, stage 3 unspecified: Secondary | ICD-10-CM

## 2021-12-22 MED ORDER — HYDROCODONE-ACETAMINOPHEN 5-325 MG PO TABS: 1.0000 | ORAL_TABLET | Freq: Four times a day (QID) | ORAL | 0 refills | Status: DC | PRN

## 2021-12-22 MED ORDER — PREMARIN 0.625 MG/GM VA CREA: TOPICAL_CREAM | VAGINAL | 12 refills | Status: DC

## 2021-12-22 NOTE — Assessment & Plan Note (Signed)
Blue-Emu cream was recommended to use 2-3 times a day ? ?

## 2021-12-22 NOTE — Assessment & Plan Note (Signed)
MVI

## 2021-12-22 NOTE — Assessment & Plan Note (Signed)
Norco prn - rare  Potential benefits of a long term opioids use as well as potential risks (i.e. addiction risk, apnea etc) and complications (i.e. Somnolence, constipation and others) were explained to the patient and were aknowledged. Pt is considering THR

## 2021-12-22 NOTE — Progress Notes (Signed)
Subjective:  Patient ID: Amanda Cole, female    DOB: Sep 27, 1940  Age: 81 y.o. MRN: [id number]  CC: No chief complaint on file.   HPI Amanda Cole presents for atrophic vaginitis, OA  Outpatient Medications Prior to Visit  Medication Sig Dispense Refill   Cholecalciferol (VITAMIN D3) 50 MCG (2000 UT) capsule Take 1 capsule (2,000 Units total) by mouth daily. 100 capsule 3   Multiple Vitamins-Minerals (ONE-A-DAY 50 PLUS PO) Take 1 tablet by mouth daily.     conjugated estrogens (PREMARIN) vaginal cream Use PV as directed 42.5 g 3   sulfamethoxazole-trimethoprim (BACTRIM DS) 800-160 MG tablet Take 1 tablet by mouth 2 (two) times daily. 10 tablet 0   No facility-administered medications prior to visit.    ROS: Review of Systems  Constitutional:  Negative for activity change, appetite change, chills, fatigue and unexpected weight change.  HENT:  Negative for congestion, mouth sores and sinus pressure.   Eyes:  Negative for visual disturbance.  Respiratory:  Negative for cough and chest tightness.   Gastrointestinal:  Negative for abdominal pain and nausea.  Genitourinary:  Negative for difficulty urinating, frequency and vaginal pain.  Musculoskeletal:  Positive for arthralgias and gait problem. Negative for back pain.  Skin:  Negative for pallor and rash.  Neurological:  Negative for dizziness, tremors, weakness, numbness and headaches.  Psychiatric/Behavioral:  Negative for confusion and sleep disturbance.     Objective:  BP 118/68 (BP Location: Left Arm, Patient Position: Sitting, Cuff Size: Normal)   Pulse (!) 58   Temp 97.9 F (36.6 C) (Oral)   Ht 5' 3.5" (1.613 m)   Wt 125 lb (56.7 kg)   SpO2 97%   BMI 21.80 kg/m   BP Readings from Last 3 Encounters:  12/22/21 118/68  08/09/21 122/68  06/23/21 120/70    Wt Readings from Last 3 Encounters:  12/22/21 125 lb (56.7 kg)  08/09/21 123 lb 9.6 oz (56.1 kg)  06/23/21 123 lb (55.8 kg)    Physical  Exam Constitutional:      Appearance: Normal appearance.  Musculoskeletal:        General: Tenderness present.   R hip stiff  Lab Results  Component Value Date   WBC 4.5 12/30/2020   HGB 13.2 12/30/2020   HCT 40.3 12/30/2020   PLT 197.0 12/30/2020   GLUCOSE 81 06/29/2020   CHOL 256 (H) 12/30/2020   TRIG 140.0 12/30/2020   HDL 58.00 12/30/2020   LDLDIRECT 161.5 03/29/2012   LDLCALC 170 (H) 12/30/2020   ALT 57 (H) 06/29/2020   AST 40 (H) 06/29/2020   NA 137 06/29/2020   K 4.2 06/29/2020   CL 103 06/29/2020   CREATININE 1.01 06/29/2020   BUN 18 06/29/2020   CO2 28 06/29/2020   TSH 2.81 12/30/2020   MICROALBUR <0.7 06/29/2020    US Abdomen Limited RUQ (LIVER/GB)  Result Date: 06/07/2021 CLINICAL DATA:  Elevated liver function tests. EXAM: ULTRASOUND ABDOMEN LIMITED RIGHT UPPER QUADRANT COMPARISON:  November 28, 2019 FINDINGS: Gallbladder: No gallstones or wall thickening visualized (2.0 mm). No sonographic Murphy sign noted by sonographer. Common bile duct: Diameter: 4.2 mm Liver: No focal lesion identified. The liver parenchyma is heterogeneous in appearance and nodular in contour. Mild intrahepatic biliary dilatation is noted. Portal vein is patent on color Doppler imaging with normal direction of blood flow towards the liver. Other: None. IMPRESSION: Heterogeneous, nodular echotexture of the liver and mild intrahepatic biliary dilatation, without focal liver lesions. Electronically Signed   By:  Virgina Norfolk M.D.   On: 06/07/2021 20:44    Assessment & Plan:   Problem List Items Addressed This Visit     Chronic inflammatory arthritis    Blue-Emu cream was recommended to use 2-3 times a day       Relevant Medications   HYDROcodone-acetaminophen (NORCO/VICODIN) 5-325 MG tablet   HIP PAIN    Norco prn - rare  Potential benefits of a long term opioids use as well as potential risks (i.e. addiction risk, apnea etc) and complications (i.e. Somnolence, constipation and  others) were explained to the patient and were aknowledged. Pt is considering THR       Vitamin B12 deficiency    MVI         Meds ordered this encounter  Medications   conjugated estrogens (PREMARIN) vaginal cream    Sig: Use PV as directed    Dispense:  42.5 g    Refill:  12   HYDROcodone-acetaminophen (NORCO/VICODIN) 5-325 MG tablet    Sig: Take 1 tablet by mouth every 6 (six) hours as needed for severe pain.    Dispense:  20 tablet    Refill:  0      Follow-up: No follow-ups on file.  Walker Kehr, MD

## 2021-12-25 MED ORDER — TRIAMCINOLONE ACETONIDE 40 MG/ML IJ SUSP
60.0000 mg | INTRAMUSCULAR | Status: AC | PRN
  Administered 2021-12-15: 60 mg via INTRA_ARTICULAR

## 2021-12-25 MED ORDER — BUPIVACAINE HCL 0.25 % IJ SOLN
4.0000 mL | INTRAMUSCULAR | Status: AC | PRN
  Administered 2021-12-15: 4 mL via INTRA_ARTICULAR

## 2021-12-26 LAB — COMPREHENSIVE METABOLIC PANEL
ALT: 46 U/L — ABNORMAL HIGH (ref 0–35)
AST: 40 U/L — ABNORMAL HIGH (ref 0–37)
Albumin: 4 g/dL (ref 3.5–5.2)
Alkaline Phosphatase: 72 U/L (ref 39–117)
BUN: 20 mg/dL (ref 6–23)
CO2: 23 mEq/L (ref 19–32)
Calcium: 9.4 mg/dL (ref 8.4–10.5)
Chloride: 103 mEq/L (ref 96–112)
Creatinine, Ser: 1 mg/dL (ref 0.40–1.20)
GFR: 52.24 mL/min — ABNORMAL LOW (ref >60.00)
Glucose, Bld: 86 mg/dL (ref 70–99)
Potassium: 4.2 mEq/L (ref 3.5–5.1)
Sodium: 135 mEq/L (ref 135–145)
Total Bilirubin: 0.5 mg/dL (ref 0.2–1.2)
Total Protein: 7.7 g/dL (ref 6.0–8.3)

## 2022-01-03 ENCOUNTER — Ambulatory Visit: Payer: Medicare Other | Admitting: Orthopaedic Surgery

## 2022-01-03 ENCOUNTER — Encounter: Payer: Self-pay | Admitting: Orthopaedic Surgery

## 2022-01-03 DIAGNOSIS — M1611 Unilateral primary osteoarthritis, right hip: Secondary | ICD-10-CM | POA: Diagnosis not present

## 2022-01-03 DIAGNOSIS — M25551 Pain in right hip: Secondary | ICD-10-CM

## 2022-01-03 NOTE — Progress Notes (Signed)
The patient is referred from Dr. Durward Fortes to consider right hip replacement due to severe arthritis of her right hip.  She is an active 81 year old female.  She has been dealing with right hip pain for well over a year now.  She has had at least 2 intra-articular steroid injections in her right hip and the first injection helped for a while but the second injection has not been as helpful.  She does walk with a limp and when she gets up from a seated position her hip is sore and painful.  It hurts in the groin.  At times it can be 10 out of 10 pain.  At this point her right hip is definitely affecting her mobility, her quality of life and actives daily living and she was sent to me to discuss hip replacement surgery.  She does have stage III kidney disease and has been told not to take anti-inflammatories anymore.  She is a thin individual.  She is not a diabetic.  She is very active.  She currently denies any headache, chest pain, shortness of breath, fever, chills, nausea, vomiting.  On exam her left hip moves smoothly and fluidly and has no blocks to rotation and full range of motion.  Rotation of her right hip is significant limited secondary to pain but also stiffness.  X-rays were reviewed with her that were done earlier this year of her pelvis and right hip.  There is severe end-stage arthritis of the right hip with superior lateral joint space loss as well as sclerotic changes and periarticular osteophytes.  The left hip space is well-maintained.  I talked in length in detail about hip replacement surgery.  I do feel that this would help her significantly and at this point she has tried and failed all forms of conservative treatment.  We talked about what to expect from an intraoperative and postoperative course.  I showed her hip replacement model and gave her handout about hip replacement surgery.  I discussed in detail the risks and benefits of the surgery.  She is interested in having the  schedule.  We will be in touch with getting on the schedule.  All questions and concerns were answered and addressed.

## 2022-01-12 ENCOUNTER — Other Ambulatory Visit: Payer: Self-pay

## 2022-01-27 ENCOUNTER — Telehealth: Payer: Self-pay

## 2022-01-27 NOTE — Telephone Encounter (Signed)
Patient fell on left leg and scraped it up on 01/23/22.  She is scheduled for right THA on 02/10/22.  She doesn't feel like it will be completely healed but should be well on the way and scabbed over by then.  Okay to go ahead with surgery?  Do you need to look at it?

## 2022-01-30 NOTE — Telephone Encounter (Signed)
I called and advised patient.

## 2022-01-30 NOTE — Progress Notes (Signed)
Anesthesia Review:  PCP: alex Plotnikov LOV 12/22/21  Cardiologist : Chest x-ray : EKG : Echo : Stress test: Cardiac Cath :  Activity level:  Sleep Study/ CPAP : Fasting Blood Sugar :      / Checks Blood Sugar -- times a day:   Blood Thinner/ Instructions /Last Dose: ASA / Instructions/ Last Dose :

## 2022-01-30 NOTE — Patient Instructions (Signed)
SURGICAL WAITING ROOM VISITATION Patients having surgery or a procedure may have no more than 2 support people in the waiting area - these visitors may rotate.   Children under the age of 29 must have an adult with them who is not the patient. If the patient needs to stay at the hospital during part of their recovery, the visitor guidelines for inpatient rooms apply. Pre-op nurse will coordinate an appropriate time for 1 support person to accompany patient in pre-op.  This support person may not rotate.    Please refer to the [facility] website for the visitor guidelines for Inpatients (after your surgery is over and you are in a regular room).       Your procedure is scheduled on:  02/10/22    Report to [facility] Main Entrance    Report to admitting at   1045AM   Call this number if you have problems the morning of surgery [phone]   Do not eat food :After Midnight.   After Midnight you may have the following liquids until __ 1015____ Am DAY OF SURGERY  Water Non-Citrus Juices (without pulp, NO RED) Carbonated Beverages Black Coffee (NO MILK/CREAM OR CREAMERS, sugar ok)  Clear Tea (NO MILK/CREAM OR CREAMERS, sugar ok) regular and decaf                             Plain Jell-O (NO RED)                                           Fruit ices (not with fruit pulp, NO RED)                                     Popsicles (NO RED)                                                               Sports drinks like Gatorade (NO RED)                            If you have questions, please contact your surgeon's office.      Oral Hygiene is also important to reduce your risk of infection.                                    Remember - BRUSH YOUR TEETH THE MORNING OF SURGERY WITH YOUR REGULAR TOOTHPASTE   Do NOT smoke after Midnight   Take these medicines the morning of surgery with A SIP OF WATER:  none   DO NOT TAKE ANY ORAL DIABETIC MEDICATIONS DAY OF YOUR  SURGERY  Bring CPAP mask and tubing day of surgery.                              You may not have any metal on your body including hair pins, jewelry, and body piercing  Do not wear make-up, lotions, powders, perfumes/cologne, or deodorant  Do not wear nail polish including gel and S&S, artificial/acrylic nails, or any other type of covering on natural nails including finger and toenails. If you have artificial nails, gel coating, etc. that needs to be removed by a nail salon please have this removed prior to surgery or surgery may need to be canceled/ delayed if the surgeon/ anesthesia feels like they are unable to be safely monitored.   Do not shave  48 hours prior to surgery.               Men may shave face and neck.   Do not bring valuables to the hospital. [place].   Contacts, dentures or bridgework may not be worn into surgery.   Bring small overnight bag day of surgery.   DO NOT [place]. PHARMACY WILL DISPENSE MEDICATIONS LISTED ON YOUR MEDICATION LIST TO YOU DURING YOUR ADMISSION [place]!    Patients discharged on the day of surgery will not be allowed to drive home.  Someone NEEDS to stay with you for the first 24 hours after anesthesia.   Special Instructions: Bring a copy of your healthcare power of attorney and living will documents the day of surgery if you haven't scanned them before.              Please read over the following fact sheets you were given: IF [place] [phone]   If you received a COVID test during your pre-op visit  it is requested that you wear a mask when out in public, stay away from anyone that may not be feeling well and notify your surgeon if you develop symptoms. If you test positive for Covid or have been in contact with anyone that has tested positive in the last 10 days please notify  you surgeon.     [place] - Preparing for Surgery Before surgery, you can play an important role.  Because skin is not sterile, your skin needs to be as free of germs as possible.  You can reduce the number of germs on your skin by washing with CHG (chlorahexidine gluconate) soap before surgery.  CHG is an antiseptic cleaner which kills germs and bonds with the skin to continue killing germs even after washing. Please DO NOT use if you have an allergy to CHG or antibacterial soaps.  If your skin becomes reddened/irritated stop using the CHG and inform your nurse when you arrive at Short Stay. Do not shave (including legs and underarms) for at least 48 hours prior to the first CHG shower.  You may shave your face/neck. Please follow these instructions carefully:  1.  Shower with CHG Soap the night before surgery and the  morning of Surgery.  2.  If you choose to wash your hair, wash your hair first as usual with your  normal  shampoo.  3.  After you shampoo, rinse your hair and body thoroughly to remove the  shampoo.                           4.  Use CHG as you would any other liquid soap.  You can apply chg directly  to the skin and wash  Gently with a scrungie or clean washcloth.  5.  Apply the CHG Soap to your body ONLY FROM THE NECK DOWN.   Do not use on face/ open                           Wound or open sores. Avoid contact with eyes, ears mouth and genitals (private parts).                       Wash face,  Genitals (private parts) with your normal soap.             6.  Wash thoroughly, paying special attention to the area where your surgery  will be performed.  7.  Thoroughly rinse your body with warm water from the neck down.  8.  DO NOT shower/wash with your normal soap after using and rinsing off  the CHG Soap.                9.  Pat yourself dry with a clean towel.            10.  Wear clean pajamas.            11.  Place clean sheets on your bed the night of your  first shower and do not  sleep with pets. Day of Surgery : Do not apply any lotions/deodorants the morning of surgery.  Please wear clean clothes to the hospital/surgery center.  FAILURE TO FOLLOW THESE INSTRUCTIONS MAY RESULT IN THE CANCELLATION OF YOUR SURGERY PATIENT SIGNATURE_________________________________  NURSE SIGNATURE__________________________________  ________________________________________________________________________

## 2022-01-31 ENCOUNTER — Other Ambulatory Visit: Payer: Self-pay | Admitting: Physician Assistant

## 2022-01-31 ENCOUNTER — Encounter (HOSPITAL_COMMUNITY): Payer: Self-pay

## 2022-01-31 ENCOUNTER — Other Ambulatory Visit: Payer: Self-pay

## 2022-01-31 ENCOUNTER — Encounter (HOSPITAL_COMMUNITY)
Admission: RE | Admit: 2022-01-31 | Discharge: 2022-01-31 | Disposition: A | Discharge status: Home / Self Care | Payer: Medicare Other | Source: Ambulatory Visit | Attending: Orthopaedic Surgery | Admitting: Orthopaedic Surgery

## 2022-01-31 VITALS — BP 132/60 | HR 76 | Temp 98.4°F | Resp 16 | Ht 63.5 in | Wt 125.0 lb

## 2022-01-31 DIAGNOSIS — Z01812 Encounter for preprocedural laboratory examination: Secondary | ICD-10-CM | POA: Diagnosis not present

## 2022-01-31 DIAGNOSIS — Z01818 Encounter for other preprocedural examination: Secondary | ICD-10-CM

## 2022-01-31 HISTORY — DX: Chronic kidney disease, unspecified: N18.9

## 2022-01-31 HISTORY — DX: Unspecified osteoarthritis, unspecified site: M19.90

## 2022-01-31 LAB — CBC
HCT: 41 % (ref 36.0–46.0)
Hemoglobin: 13.3 g/dL (ref 12.0–15.0)
MCH: 29.2 pg (ref 26.0–34.0)
MCHC: 32.4 g/dL (ref 30.0–36.0)
MCV: 90.1 fL (ref 80.0–100.0)
Platelets: 221 10*3/uL (ref 150–400)
RBC: 4.55 MIL/uL (ref 3.87–5.11)
RDW: 14 % (ref 11.5–15.5)
WBC: 6.7 10*3/uL (ref 4.0–10.5)
nRBC: 0 % (ref 0.0–0.2)

## 2022-01-31 LAB — SURGICAL PCR SCREEN
MRSA, PCR: NEGATIVE
Staphylococcus aureus: NEGATIVE

## 2022-02-08 ENCOUNTER — Telehealth: Payer: Self-pay | Admitting: *Deleted

## 2022-02-08 NOTE — Care Plan (Signed)
[facility] RNCM call to patient to discuss her upcoming Right total hip arthroplasty with Dr. Ninfa Linden on 02/10/22. She is an Ortho bundle patient through [facility] and is agreeable to case management. She lives with her spouse, who will be assisting her after discharge. She has a hand me down standard walker, but it does not have wheels on it. She will need a RW and 3in1/BSC. Anticipate HHPT will be needed after a short hospital stay. Referral made to [facility] after choice provided. Reviewed all post op care instructions. Will continue to follow for needs.

## 2022-02-08 NOTE — Telephone Encounter (Signed)
Ortho bundle Pre-op call completed. 

## 2022-02-09 NOTE — H&P (Signed)
TOTAL HIP ADMISSION H&P  Patient is admitted for right total hip arthroplasty.  Subjective:  Chief Complaint: right hip pain  HPI: Amanda Cole, 81 y.o. female, has a history of pain and functional disability in the right hip(s) due to arthritis and patient has failed non-surgical conservative treatments for greater than 12 weeks to include NSAID's and/or analgesics, corticosteriod injections, flexibility and strengthening excercises, use of assistive devices, and activity modification.  Onset of symptoms was gradual starting 2 years ago with gradually worsening course since that time.The patient noted no past surgery on the right hip(s).  Patient currently rates pain in the right hip at 10 out of 10 with activity. Patient has night pain, worsening of pain with activity and weight bearing, trendelenberg gait, pain that interfers with activities of daily living, and pain with passive range of motion. Patient has evidence of subchondral cysts, subchondral sclerosis, periarticular osteophytes, and joint space narrowing by imaging studies. This condition presents safety issues increasing the risk of falls.  There is no current active infection.  Patient Active Problem List   Diagnosis Date Noted   Unilateral primary osteoarthritis, right hip 01/03/2022   Low back pain 06/01/2021   Atrophic vaginitis 06/29/2020   Elevated liver enzymes 08/27/2019   Chronic renal insufficiency, stage 3 (moderate) ([place]) 03/17/2019   Sleep disorder 06/26/2018   Bilateral hand pain- chronic in DIP and PIP jts 06/26/2018   Family history of rheumatoid arthritis 06/26/2018   Dysuria 05/16/2016   Seborrheic dermatitis 06/22/2015   Clavicle enlargement 05/22/2014   Well adult exam 02/29/2012   Hand pain, left 02/29/2012   LOW BACK PAIN, ACUTE 03/07/2010   Vitamin B12 deficiency 02/01/2009   PALPITATIONS 02/01/2009   CYSTITIS 05/29/2008   HIP PAIN 05/29/2008   HAND PAIN, LEFT 05/29/2008   HEADACHE 01/10/2008    RASH-NONVESICULAR 11/01/2007   HERPES ZOSTER OPHTHALMICUS 10/12/2007   HEARING IMPAIRMENT 10/03/2007   ALLERGIC RHINITIS 10/03/2007   Chronic inflammatory arthritis 10/03/2007   Dyslipidemia 05/14/2007   SINUSITIS- ACUTE-NOS 05/14/2007   Past Medical History:  Diagnosis Date   Arthritis    Cataract    Chronic kidney disease     Past Surgical History:  Procedure Laterality Date   CATARACT EXTRACTION     B    No current facility-administered medications for this encounter.   Current Outpatient Medications  Medication Sig Dispense Refill Last Dose   Cholecalciferol (VITAMIN D3) 50 MCG (2000 UT) capsule Take 1 capsule (2,000 Units total) by mouth daily. 100 capsule 3    conjugated estrogens (PREMARIN) vaginal cream Use PV as directed (Patient taking differently: Place 1 applicator vaginally daily as needed ([place]). Use PV as directed) 42.5 g 12    HYDROcodone-acetaminophen (NORCO/VICODIN) 5-325 MG tablet Take 1 tablet by mouth every 6 (six) hours as needed for severe pain. 20 tablet 0    Multiple Vitamins-Minerals (ONE-A-DAY 50 PLUS PO) Take 1 tablet by mouth daily.      Allergies  Allergen Reactions   Nitrofurantoin     REACTION: rash   Tetracycline     REACTION: rash    Social History   Tobacco Use   Smoking status: Never   Smokeless tobacco: Never  Substance Use Topics   Alcohol use: No    Family History  Problem Relation Age of Onset   Arthritis Mother        RA     Review of Systems  All other systems reviewed and are negative.   Objective:  Physical  Exam Vitals reviewed.  Constitutional:      Appearance: Normal appearance. She is normal weight.  Eyes:     Extraocular Movements: Extraocular movements intact.     Pupils: Pupils are equal, round, and reactive to light.  Cardiovascular:     Rate and Rhythm: Normal rate and regular rhythm.     Pulses: Normal pulses.  Pulmonary:     Effort: Pulmonary effort is normal.     Breath sounds: Normal breath  sounds.  Abdominal:     Palpations: Abdomen is soft.  Musculoskeletal:     Cervical back: Normal range of motion and neck supple.     Right hip: Tenderness and bony tenderness present. Decreased range of motion. Decreased strength.  Neurological:     Mental Status: She is alert and oriented to person, place, and time.  Psychiatric:        Behavior: Behavior normal.     Vital signs in last 24 hours:    Labs:   Estimated body mass index is 21.8 kg/m as calculated from the following:   Height as of 01/31/22: 5' 3.5" (1.613 m).   Weight as of 01/31/22: 56.7 kg.   Imaging Review Plain radiographs demonstrate severe degenerative joint disease of the right hip(s). The bone quality appears to be good for age and reported activity level.      Assessment/Plan:  End stage arthritis, right hip(s)  The patient history, physical examination, clinical judgement of the provider and imaging studies are consistent with end stage degenerative joint disease of the right hip(s) and total hip arthroplasty is deemed medically necessary. The treatment options including medical management, injection therapy, arthroscopy and arthroplasty were discussed at length. The risks and benefits of total hip arthroplasty were presented and reviewed. The risks due to aseptic loosening, infection, stiffness, dislocation/subluxation,  thromboembolic complications and other imponderables were discussed.  The patient acknowledged the explanation, agreed to proceed with the plan and consent was signed. Patient is being admitted for inpatient treatment for surgery, pain control, PT, OT, prophylactic antibiotics, VTE prophylaxis, progressive ambulation and ADL's and discharge planning.The patient is planning to be discharged home with home health services

## 2022-02-10 ENCOUNTER — Ambulatory Visit (HOSPITAL_COMMUNITY): Payer: Medicare Other

## 2022-02-10 ENCOUNTER — Ambulatory Visit (HOSPITAL_BASED_OUTPATIENT_CLINIC_OR_DEPARTMENT_OTHER): Payer: Medicare Other | Admitting: Certified Registered Nurse Anesthetist

## 2022-02-10 ENCOUNTER — Observation Stay (HOSPITAL_COMMUNITY)
Admission: RE | Admit: 2022-02-10 | Discharge: 2022-02-11 | Disposition: A | Discharge status: Home Health | Payer: Medicare Other | Source: Ambulatory Visit | Attending: Orthopaedic Surgery | Admitting: Orthopaedic Surgery

## 2022-02-10 ENCOUNTER — Other Ambulatory Visit: Payer: Self-pay

## 2022-02-10 ENCOUNTER — Encounter (HOSPITAL_COMMUNITY)
Admission: RE | Disposition: A | Discharge status: Home Health | Payer: Self-pay | Source: Ambulatory Visit | Attending: Orthopaedic Surgery

## 2022-02-10 ENCOUNTER — Ambulatory Visit (HOSPITAL_COMMUNITY): Payer: Medicare Other | Admitting: Physician Assistant

## 2022-02-10 ENCOUNTER — Observation Stay (HOSPITAL_COMMUNITY): Payer: Medicare Other

## 2022-02-10 ENCOUNTER — Encounter (HOSPITAL_COMMUNITY): Payer: Self-pay | Admitting: Orthopaedic Surgery

## 2022-02-10 DIAGNOSIS — Z79899 Other long term (current) drug therapy: Secondary | ICD-10-CM | POA: Insufficient documentation

## 2022-02-10 DIAGNOSIS — R600 Localized edema: Secondary | ICD-10-CM | POA: Diagnosis not present

## 2022-02-10 DIAGNOSIS — M6281 Muscle weakness (generalized): Secondary | ICD-10-CM | POA: Diagnosis not present

## 2022-02-10 DIAGNOSIS — N183 Chronic kidney disease, stage 3 unspecified: Secondary | ICD-10-CM | POA: Diagnosis not present

## 2022-02-10 DIAGNOSIS — N289 Disorder of kidney and ureter, unspecified: Secondary | ICD-10-CM

## 2022-02-10 DIAGNOSIS — M1611 Unilateral primary osteoarthritis, right hip: Secondary | ICD-10-CM | POA: Diagnosis not present

## 2022-02-10 DIAGNOSIS — Z96641 Presence of right artificial hip joint: Secondary | ICD-10-CM

## 2022-02-10 DIAGNOSIS — Z01818 Encounter for other preprocedural examination: Secondary | ICD-10-CM

## 2022-02-10 DIAGNOSIS — Z471 Aftercare following joint replacement surgery: Secondary | ICD-10-CM | POA: Diagnosis not present

## 2022-02-10 HISTORY — PX: TOTAL HIP ARTHROPLASTY: SHX124

## 2022-02-10 LAB — TYPE AND SCREEN
ABO/RH(D): O POS
Antibody Screen: NEGATIVE

## 2022-02-10 LAB — ABO/RH: ABO/RH(D): O POS

## 2022-02-10 SURGERY — ARTHROPLASTY, HIP, TOTAL, ANTERIOR APPROACH
Anesthesia: General | Site: Hip | Laterality: Right

## 2022-02-10 MED ORDER — PROPOFOL 10 MG/ML IV BOLUS
INTRAVENOUS | Status: DC | PRN
  Administered 2022-02-10: 50 mg via INTRAVENOUS
  Administered 2022-02-10: 30 mg via INTRAVENOUS
  Administered 2022-02-10 (×2): 20 mg via INTRAVENOUS
  Administered 2022-02-10: 30 mg via INTRAVENOUS

## 2022-02-10 MED ORDER — TRANEXAMIC ACID-NACL 1000-0.7 MG/100ML-% IV SOLN
1000.0000 mg | INTRAVENOUS | Status: AC
  Administered 2022-02-10: 1000 mg via INTRAVENOUS
  Filled 2022-02-10: qty 100

## 2022-02-10 MED ORDER — METHOCARBAMOL 1000 MG/10ML IJ SOLN: 500.0000 mg | Freq: Four times a day (QID) | INTRAVENOUS | Status: DC | PRN

## 2022-02-10 MED ORDER — FENTANYL CITRATE PF 50 MCG/ML IJ SOSY
25.0000 ug | PREFILLED_SYRINGE | INTRAMUSCULAR | Status: DC | PRN
  Administered 2022-02-10: 25 ug via INTRAVENOUS

## 2022-02-10 MED ORDER — CHLORHEXIDINE GLUCONATE 0.12 % MT SOLN
15.0000 mL | Freq: Once | OROMUCOSAL | Status: AC
  Administered 2022-02-10: 15 mL via OROMUCOSAL

## 2022-02-10 MED ORDER — METOCLOPRAMIDE HCL 5 MG PO TABS: 5.0000 mg | ORAL_TABLET | Freq: Three times a day (TID) | ORAL | Status: DC | PRN

## 2022-02-10 MED ORDER — SUGAMMADEX SODIUM 200 MG/2ML IV SOLN
INTRAVENOUS | Status: DC | PRN
  Administered 2022-02-10: 200 mg via INTRAVENOUS

## 2022-02-10 MED ORDER — ORAL CARE MOUTH RINSE: 15.0000 mL | Freq: Once | OROMUCOSAL | Status: AC

## 2022-02-10 MED ORDER — DOCUSATE SODIUM 100 MG PO CAPS
100.0000 mg | ORAL_CAPSULE | Freq: Two times a day (BID) | ORAL | Status: DC
  Administered 2022-02-10 – 2022-02-11 (×2): 100 mg via ORAL
  Filled 2022-02-10 (×2): qty 1

## 2022-02-10 MED ORDER — MENTHOL 3 MG MT LOZG: 1.0000 | LOZENGE | OROMUCOSAL | Status: DC | PRN

## 2022-02-10 MED ORDER — FENTANYL CITRATE (PF) 100 MCG/2ML IJ SOLN
INTRAMUSCULAR | Status: AC
  Filled 2022-02-10: qty 2

## 2022-02-10 MED ORDER — PROPOFOL 500 MG/50ML IV EMUL
INTRAVENOUS | Status: DC | PRN
  Administered 2022-02-10: 50 ug/kg/min via INTRAVENOUS

## 2022-02-10 MED ORDER — PHENYLEPHRINE 80 MCG/ML (10ML) SYRINGE FOR IV PUSH (FOR BLOOD PRESSURE SUPPORT)
PREFILLED_SYRINGE | INTRAVENOUS | Status: AC
  Filled 2022-02-10: qty 10

## 2022-02-10 MED ORDER — ASPIRIN 81 MG PO CHEW
81.0000 mg | CHEWABLE_TABLET | Freq: Two times a day (BID) | ORAL | Status: DC
  Administered 2022-02-10 – 2022-02-11 (×2): 81 mg via ORAL
  Filled 2022-02-10 (×2): qty 1

## 2022-02-10 MED ORDER — PHENOL 1.4 % MT LIQD: 1.0000 | OROMUCOSAL | Status: DC | PRN

## 2022-02-10 MED ORDER — METHOCARBAMOL 500 MG PO TABS: 500.0000 mg | ORAL_TABLET | Freq: Four times a day (QID) | ORAL | Status: DC | PRN

## 2022-02-10 MED ORDER — ONDANSETRON HCL 4 MG/2ML IJ SOLN
INTRAMUSCULAR | Status: DC | PRN
  Administered 2022-02-10: 4 mg via INTRAVENOUS

## 2022-02-10 MED ORDER — DIPHENHYDRAMINE HCL 12.5 MG/5ML PO ELIX: 12.5000 mg | ORAL_SOLUTION | ORAL | Status: DC | PRN

## 2022-02-10 MED ORDER — ACETAMINOPHEN 325 MG PO TABS: 325.0000 mg | ORAL_TABLET | Freq: Four times a day (QID) | ORAL | Status: DC | PRN

## 2022-02-10 MED ORDER — HYDROCODONE-ACETAMINOPHEN 5-325 MG PO TABS
1.0000 | ORAL_TABLET | ORAL | Status: DC | PRN
  Administered 2022-02-10: 2 via ORAL
  Administered 2022-02-11 (×2): 1 via ORAL
  Filled 2022-02-10: qty 2
  Filled 2022-02-10 (×2): qty 1

## 2022-02-10 MED ORDER — HYDROCODONE-ACETAMINOPHEN 7.5-325 MG PO TABS
1.0000 | ORAL_TABLET | ORAL | Status: DC | PRN
  Administered 2022-02-10 – 2022-02-11 (×3): 2 via ORAL
  Filled 2022-02-10 (×3): qty 2

## 2022-02-10 MED ORDER — CEFAZOLIN SODIUM-DEXTROSE 2-4 GM/100ML-% IV SOLN
2.0000 g | INTRAVENOUS | Status: AC
  Administered 2022-02-10: 2 g via INTRAVENOUS
  Filled 2022-02-10: qty 100

## 2022-02-10 MED ORDER — VITAMIN D 25 MCG (1000 UNIT) PO TABS
2000.0000 [IU] | ORAL_TABLET | Freq: Every day | ORAL | Status: DC
  Administered 2022-02-10 – 2022-02-11 (×2): 2000 [IU] via ORAL
  Filled 2022-02-10 (×2): qty 2

## 2022-02-10 MED ORDER — ONDANSETRON HCL 4 MG/2ML IJ SOLN
4.0000 mg | Freq: Four times a day (QID) | INTRAMUSCULAR | Status: DC | PRN
  Administered 2022-02-11: 4 mg via INTRAVENOUS
  Filled 2022-02-10: qty 2

## 2022-02-10 MED ORDER — MORPHINE SULFATE (PF) 2 MG/ML IV SOLN: 0.5000 mg | INTRAVENOUS | Status: DC | PRN

## 2022-02-10 MED ORDER — POVIDONE-IODINE 10 % EX SWAB
2.0000 | Freq: Once | CUTANEOUS | Status: AC
  Administered 2022-02-10: 2 via TOPICAL

## 2022-02-10 MED ORDER — FENTANYL CITRATE PF 50 MCG/ML IJ SOSY
PREFILLED_SYRINGE | INTRAMUSCULAR | Status: AC
  Filled 2022-02-10: qty 1

## 2022-02-10 MED ORDER — SODIUM CHLORIDE 0.9 % IV SOLN: INTRAVENOUS | Status: DC

## 2022-02-10 MED ORDER — ALUM & MAG HYDROXIDE-SIMETH 200-200-20 MG/5ML PO SUSP: 30.0000 mL | ORAL | Status: DC | PRN

## 2022-02-10 MED ORDER — PANTOPRAZOLE SODIUM 40 MG PO TBEC
40.0000 mg | DELAYED_RELEASE_TABLET | Freq: Every day | ORAL | Status: DC
  Administered 2022-02-10 – 2022-02-11 (×2): 40 mg via ORAL
  Filled 2022-02-10 (×2): qty 1

## 2022-02-10 MED ORDER — FENTANYL CITRATE (PF) 100 MCG/2ML IJ SOLN
INTRAMUSCULAR | Status: DC | PRN
  Administered 2022-02-10 (×2): 50 ug via INTRAVENOUS

## 2022-02-10 MED ORDER — ONDANSETRON HCL 4 MG/2ML IJ SOLN: 4.0000 mg | Freq: Once | INTRAMUSCULAR | Status: DC | PRN

## 2022-02-10 MED ORDER — PHENYLEPHRINE 80 MCG/ML (10ML) SYRINGE FOR IV PUSH (FOR BLOOD PRESSURE SUPPORT)
PREFILLED_SYRINGE | INTRAVENOUS | Status: DC | PRN
  Administered 2022-02-10: 80 ug via INTRAVENOUS

## 2022-02-10 MED ORDER — SODIUM CHLORIDE 0.9 % IR SOLN
Status: DC | PRN
  Administered 2022-02-10: 1000 mL

## 2022-02-10 MED ORDER — 0.9 % SODIUM CHLORIDE (POUR BTL) OPTIME
TOPICAL | Status: DC | PRN
  Administered 2022-02-10: 500 mL

## 2022-02-10 MED ORDER — ROCURONIUM BROMIDE 10 MG/ML (PF) SYRINGE
PREFILLED_SYRINGE | INTRAVENOUS | Status: DC | PRN
  Administered 2022-02-10: 50 mg via INTRAVENOUS

## 2022-02-10 MED ORDER — METOCLOPRAMIDE HCL 5 MG/ML IJ SOLN: 5.0000 mg | Freq: Three times a day (TID) | INTRAMUSCULAR | Status: DC | PRN

## 2022-02-10 MED ORDER — CEFAZOLIN SODIUM-DEXTROSE 1-4 GM/50ML-% IV SOLN
1.0000 g | Freq: Four times a day (QID) | INTRAVENOUS | Status: AC
  Administered 2022-02-10 – 2022-02-11 (×2): 1 g via INTRAVENOUS
  Filled 2022-02-10 (×2): qty 50

## 2022-02-10 MED ORDER — ONDANSETRON HCL 4 MG PO TABS: 4.0000 mg | ORAL_TABLET | Freq: Four times a day (QID) | ORAL | Status: DC | PRN

## 2022-02-10 MED ORDER — LACTATED RINGERS IV SOLN: INTRAVENOUS | Status: DC

## 2022-02-10 MED ORDER — BUPIVACAINE IN DEXTROSE 0.75-8.25 % IT SOLN
INTRATHECAL | Status: DC | PRN
  Administered 2022-02-10: 1.6 mL via INTRATHECAL

## 2022-02-10 SURGICAL SUPPLY — 43 items
ACETAB CUP W/GRIPTION 54 (Plate) ×1 IMPLANT
APL SKNCLS STERI-STRIP NONHPOA (GAUZE/BANDAGES/DRESSINGS)
ARTICULEZE HEAD (Hips) ×1 IMPLANT
BAG COUNTER SPONGE SURGICOUNT (BAG) ×1 IMPLANT
BAG SPEC THK2 15X12 ZIP CLS (MISCELLANEOUS)
BAG SPNG CNTER NS LX DISP (BAG) ×1
BAG ZIPLOCK 12X15 (MISCELLANEOUS) IMPLANT
BENZOIN TINCTURE PRP APPL 2/3 (GAUZE/BANDAGES/DRESSINGS) IMPLANT
BLADE SAW SGTL 18X1.27X75 (BLADE) ×1 IMPLANT
COVER PERINEAL POST (MISCELLANEOUS) ×1 IMPLANT
COVER SURGICAL LIGHT HANDLE (MISCELLANEOUS) ×1 IMPLANT
CUP ACETAB W/GRIPTION 54 (Plate) IMPLANT
DRAPE FOOT SWITCH (DRAPES) ×1 IMPLANT
DRAPE STERI IOBAN 125X83 (DRAPES) ×1 IMPLANT
DRAPE U-SHAPE 47X51 STRL (DRAPES) ×2 IMPLANT
DRSG AQUACEL AG ADV 3.5X10 (GAUZE/BANDAGES/DRESSINGS) ×1 IMPLANT
DURAPREP 26ML APPLICATOR (WOUND CARE) ×1 IMPLANT
ELECT REM PT RETURN 15FT ADLT (MISCELLANEOUS) ×1 IMPLANT
GAUZE XEROFORM 1X8 LF (GAUZE/BANDAGES/DRESSINGS) ×1 IMPLANT
GLOVE BIO SURGEON STRL SZ7.5 (GLOVE) ×1 IMPLANT
GLOVE BIOGEL PI IND STRL 8 (GLOVE) ×2 IMPLANT
GLOVE ECLIPSE 8.0 STRL XLNG CF (GLOVE) ×1 IMPLANT
GOWN STRL REUS W/ TWL XL LVL3 (GOWN DISPOSABLE) ×2 IMPLANT
GOWN STRL REUS W/TWL XL LVL3 (GOWN DISPOSABLE) ×2
HANDPIECE INTERPULSE COAX TIP (DISPOSABLE) ×1
HEAD ARTICULEZE (Hips) IMPLANT
HOLDER FOLEY CATH W/STRAP (MISCELLANEOUS) ×1 IMPLANT
KIT TURNOVER KIT A (KITS) IMPLANT
LINER PINN ACET NEUT 32X54 (Liner) IMPLANT
PACK ANTERIOR HIP CUSTOM (KITS) ×1 IMPLANT
SET HNDPC FAN SPRY TIP SCT (DISPOSABLE) ×1 IMPLANT
STAPLER VISISTAT 35W (STAPLE) IMPLANT
STEM FEM ACTIS STD SZ4 (Stem) IMPLANT
STRIP CLOSURE SKIN 1/2X4 (GAUZE/BANDAGES/DRESSINGS) IMPLANT
SUT ETHIBOND NAB CT1 #1 30IN (SUTURE) ×1 IMPLANT
SUT ETHILON 2 0 PS N (SUTURE) IMPLANT
SUT MNCRL AB 4-0 PS2 18 (SUTURE) IMPLANT
SUT VIC AB 0 CT1 36 (SUTURE) ×1 IMPLANT
SUT VIC AB 1 CT1 36 (SUTURE) ×1 IMPLANT
SUT VIC AB 2-0 CT1 27 (SUTURE) ×2
SUT VIC AB 2-0 CT1 TAPERPNT 27 (SUTURE) ×2 IMPLANT
TRAY FOLEY MTR SLVR 16FR STAT (SET/KITS/TRAYS/PACK) IMPLANT
YANKAUER SUCT BULB TIP NO VENT (SUCTIONS) ×1 IMPLANT

## 2022-02-10 NOTE — Progress Notes (Signed)
Pt presents for surgery with large eschar anterior left low leg from 3 weeks prior.  She states Dr Ninfa Linden is aware.  Also she has small red raised area right eye she was unaware of.

## 2022-02-10 NOTE — Evaluation (Addendum)
Physical Therapy Evaluation Patient Details Name: Amanda Cole MRN: [id number] DOB: [birth year] Today's Date: 02/10/2022  History of Present Illness  Pt is an 81yo female presenting s/p R-THA, AA on 02/10/22. PMH: CKD, low back pain, bilateral hand pain, hard of hearing  Clinical Impression  Amanda Cole is a 81 y.o. female POD 0 s/p R-THA,AA. Patient reports IND with mobility at baseline. Patient is now limited by functional impairments (see PT problem list below) and requires min-mod assist for bed mobility, further mobility deferred secondary to high degree of pain, session consisted of dangling EOB, exercises, and education on appropriate hand protection during mobility as pt has severe bilateral hand arthritis. Patient instructed in exercise to facilitate ROM and circulation to manage edema. Provided incentive spirometer and with Vcs pt able to achieve 2516m. Patient will benefit from continued skilled PT interventions to address impairments and progress towards PLOF. Acute PT will follow to progress mobility and stair training in preparation for safe discharge home.       Recommendations for follow up therapy are one component of a multi-disciplinary discharge planning process, led by the attending physician.  Recommendations may be updated based on patient status, additional functional criteria and insurance authorization.  Follow Up Recommendations Follow physician's recommendations for discharge plan and follow up therapies      Assistance Recommended at Discharge Frequent or constant Supervision/Assistance  Patient can return home with the following  A little help with walking and/or transfers;A little help with bathing/dressing/bathroom;Assistance with cooking/housework;Assist for transportation;Help with stairs or ramp for entrance    Equipment Recommendations Rolling walker (2 wheels);BSC/3in1  Recommendations for Other Services       Functional Status Assessment Patient  has had a recent decline in their functional status and demonstrates the ability to make significant improvements in function in a reasonable and predictable amount of time.     Precautions / Restrictions Precautions Precautions: Fall Restrictions Weight Bearing Restrictions: No Other Position/Activity Restrictions: wbat      Mobility  Bed Mobility Overal bed mobility: Needs Assistance Bed Mobility: Supine to Sit, Sit to Supine     Supine to sit: Min assist, HOB elevated Sit to supine: Mod assist, +2 for safety/equipment, +2 for physical assistance   General bed mobility comments: Supine to sit: pt required min assist for trunk elevation and to bring BLE off bed; educated pt on use of gait belt to self-assist with RLE, pt with increased time and moderate encouragement secondary to pain. Pt dangled EOB for ~350m then returned to supine. Sit to supine required mod assist +2 for bringing BLE into bed and safe lowering of trunk to bed. Pt able to scoot up and laterally in bed using LLE and BUE with cuing. Further mobility deferred secondary to pain    Transfers                        Ambulation/Gait               General Gait Details: deferred  Stairs            Wheelchair Mobility    Modified Rankin (Stroke Patients Only)       Balance Overall balance assessment: Mild deficits observed, not formally tested, History of Falls  Pertinent Vitals/Pain Pain Assessment Pain Assessment: 0-10 Pain Score: 8  Pain Location: right hip Pain Descriptors / Indicators: Operative site guarding, Discomfort, Grimacing Pain Intervention(s): Limited activity within patient's tolerance, Monitored during session, Repositioned, Ice applied    Home Living Family/patient expects to be discharged to:: Private residence Living Arrangements: Spouse/significant other Available Help at Discharge: Family;Available 24  hours/day Type of Home: House Home Access: Stairs to enter Entrance Stairs-Rails: Left Entrance Stairs-Number of Steps: 2   Home Layout: One level Home Equipment: None      Prior Function Prior Level of Function : Independent/Modified Independent;History of Falls (last six months)             Mobility Comments: Falls: leaned on something in the toolshed and it fell and she fell with it. ADLs Comments: IND     Hand Dominance        Extremity/Trunk Assessment   Upper Extremity Assessment Upper Extremity Assessment: Overall WFL for tasks assessed    Lower Extremity Assessment Lower Extremity Assessment: LLE deficits/detail;RLE deficits/detail RLE Deficits / Details: MMT ank DF/PF 5/5 RLE Sensation: WNL LLE Deficits / Details: MMT ank DF/PF 5/5 LLE Sensation: WNL    Cervical / Trunk Assessment Cervical / Trunk Assessment: Kyphotic  Communication   Communication: No difficulties  Cognition Arousal/Alertness: Awake/alert Behavior During Therapy: WFL for tasks assessed/performed Overall Cognitive Status: Within Functional Limits for tasks assessed                                          General Comments General comments (skin integrity, edema, etc.): Daughter Judeen Hammans and Husband Fritz Pickerel    Exercises Total Joint Exercises Ankle Circles/Pumps: AROM, Both, 20 reps   Assessment/Plan    PT Assessment Patient needs continued PT services  PT Problem List Decreased strength;Decreased range of motion;Decreased activity tolerance;Decreased balance;Decreased mobility;Decreased coordination;Pain       PT Treatment Interventions DME instruction;Gait training;Stair training;Functional mobility training;Therapeutic activities;Therapeutic exercise;Balance training;Neuromuscular re-education;Patient/family education    PT Goals (Current goals can be found in the Care Plan section)  Acute Rehab PT Goals Patient Stated Goal: Walking peacefully and without  pain PT Goal Formulation: With patient Time For Goal Achievement: 02/17/22 Potential to Achieve Goals: Good    Frequency 7X/week     Co-evaluation               AM-PAC PT "6 Clicks" Mobility  Outcome Measure Help needed turning from your back to your side while in a flat bed without using bedrails?: A Little Help needed moving from lying on your back to sitting on the side of a flat bed without using bedrails?: A Little Help needed moving to and from a bed to a chair (including a wheelchair)?: A Little Help needed standing up from a chair using your arms (e.g., wheelchair or bedside chair)?: A Little Help needed to walk in hospital room?: A Little Help needed climbing 3-5 steps with a railing? : A Little 6 Click Score: 18    End of Session Equipment Utilized During Treatment: Gait belt Activity Tolerance: Patient limited by pain Patient left: in bed;with call bell/phone within reach;with bed alarm set;with family/visitor present;with SCD's reapplied Nurse Communication: Mobility status PT Visit Diagnosis: Pain;Difficulty in walking, not elsewhere classified (R26.2);History of falling (Z91.81) Pain - Right/Left: Right Pain - part of body: Hip    Time: 1710-1740 PT Time Calculation (min) (ACUTE ONLY): 30  min   Charges:   PT Evaluation $PT Eval Low Complexity: 1 Low PT Treatments $Therapeutic Activity: 8-22 mins        Coolidge Breeze, PT, DPT [facility] Department Office: [phone] Weekend pager: [phone]  Coolidge Breeze 02/10/2022, 6:02 PM

## 2022-02-10 NOTE — TOC Transition Note (Signed)
Transition of Care [facility]) - CM/SW Discharge Note   Patient Details  Name: Amanda Cole MRN: [id number] Date of Birth: 03-Mar-1941  [facility] CM/SW Contact:  Lennart Pall, LCSW Phone Number: 02/10/2022, 4:32 PM   Clinical Narrative:    Met with pt and confirming need for RW and 3n1 commode with no agency pref - referral placed with [employer] for delivery to room.  HHPT prearranged with [facility].  No further TOC needs.   Final next level of care: [place] Barriers to Discharge: No Barriers Identified   Patient Goals and CMS Choice Patient states their goals for this hospitalization and ongoing recovery are:: return home      Discharge Placement                       Discharge Plan and Services                DME Arranged: 3-N-1, Walker rolling DME Agency: [employer] Date DME Agency Contacted: 02/10/22 Time DME Agency Contacted: [id number] Representative spoke with at DME Agency: [place]: PT [place]: [place]        Social Determinants of Health (SDOH) Interventions     Readmission Risk Interventions     No data to display

## 2022-02-10 NOTE — Anesthesia Postprocedure Evaluation (Signed)
Anesthesia Post Note  Patient: Amanda Cole  Procedure(s) Performed: RIGHT TOTAL HIP ARTHROPLASTY ANTERIOR APPROACH (Right: Hip)     Patient location during evaluation: PACU Anesthesia Type: General Level of consciousness: awake and alert Pain management: pain level controlled Vital Signs Assessment: post-procedure vital signs reviewed and stable Respiratory status: spontaneous breathing, nonlabored ventilation, respiratory function stable and patient connected to nasal cannula oxygen Cardiovascular status: blood pressure returned to baseline and stable Postop Assessment: no apparent nausea or vomiting Anesthetic complications: yes   Encounter Notable Events  Notable Event Outcome Phase Comment  Difficult to intubate - unexpected  Intraprocedure Filed from anesthesia note documentation.    Last Vitals:  Vitals:   02/10/22 1545 02/10/22 1605  BP: 138/62 (!) 146/66  Pulse: 69 71  Resp: 14   Temp: 36.5 C 36.5 C  SpO2: 99% 98%    Last Pain:  Vitals:   02/10/22 1716  TempSrc:   PainSc: [place]

## 2022-02-10 NOTE — Interval H&P Note (Signed)
History and Physical Interval Note: The patient understands that she is here today for a right hip replacement to treat her right hip osteoarthritis.  There has been no acute or interval change in her medical status.  See H&P.  The risks and benefits of surgery been explained in detail and informed consent is obtained.  The right operative hip has been marked.  02/10/2022 11:18 AM  Amanda Cole  has presented today for surgery, with the diagnosis of OSTEOARTHRITIS / [name].  The various methods of treatment have been discussed with the patient and family. After consideration of risks, benefits and other options for treatment, the patient has consented to  Procedure(s): RIGHT TOTAL HIP ARTHROPLASTY ANTERIOR APPROACH (Right) as a surgical intervention.  The patient's history has been reviewed, patient examined, no change in status, stable for surgery.  I have reviewed the patient's chart and labs.  Questions were answered to the patient's satisfaction.     [name]

## 2022-02-10 NOTE — Anesthesia Preprocedure Evaluation (Addendum)
Anesthesia Evaluation  Patient identified by MRN, date of birth, ID band Patient awake    Reviewed: Allergy & Precautions, NPO status , Patient's Chart, lab work & pertinent test results  History of Anesthesia Complications Negative for: history of anesthetic complications  Airway Mallampati: III  TM Distance: >3 FB Neck ROM: Full    Dental  (+) Teeth Intact, Dental Advisory Given, Implants,    Pulmonary neg pulmonary ROS,    Pulmonary exam normal breath sounds clear to auscultation       Cardiovascular Exercise Tolerance: Good negative cardio ROS Normal cardiovascular exam Rhythm:Regular Rate:Normal     Neuro/Psych  Headaches, negative psych ROS   GI/Hepatic negative GI ROS, Neg liver ROS,   Endo/Other  negative endocrine ROS  Renal/GU Renal InsufficiencyRenal disease     Musculoskeletal  (+) Arthritis  (OSTEOARTHRITIS / DEGENERATIVE JOINT DISEASE RIGHT HIP),   Abdominal   Peds  Hematology Plt 221k   Anesthesia Other Findings Day of surgery medications reviewed with the patient.  Reproductive/Obstetrics                            Anesthesia Physical Anesthesia Plan  ASA: 3  Anesthesia Plan: Spinal   Post-op Pain Management:    Induction: Intravenous  PONV Risk Score and Plan: 2 and TIVA, Dexamethasone and Ondansetron  Airway Management Planned: Natural Airway and Simple Face Mask  Additional Equipment:   Intra-op Plan:   Post-operative Plan:   Informed Consent: I have reviewed the patients History and Physical, chart, labs and discussed the procedure including the risks, benefits and alternatives for the proposed anesthesia with the patient or authorized representative who has indicated his/her understanding and acceptance.     Dental advisory given  Plan Discussed with: CRNA  Anesthesia Plan Comments:        Anesthesia Quick Evaluation

## 2022-02-10 NOTE — OR Nursing (Signed)
Pt arrived to OR with abrasion on her left leg from a fall at home. Dressed with aquacel dressing per Dr. Ninfa Linden

## 2022-02-10 NOTE — Anesthesia Procedure Notes (Signed)
Spinal  Patient location during procedure: OR Start time: 02/10/2022 12:46 PM End time: 02/10/2022 12:50 PM Reason for block: surgical anesthesia Staffing Performed: resident/CRNA  Resident/CRNA: Milford Cage, CRNA Performed by: Milford Cage, CRNA Authorized by: Santa Lighter, MD   Preanesthetic Checklist Completed: patient identified, IV checked, site marked, risks and benefits discussed, surgical consent, monitors and equipment checked, pre-op evaluation and timeout performed Spinal Block Patient position: sitting Prep: DuraPrep and site prepped and draped Patient monitoring: heart rate, cardiac monitor, continuous pulse ox and blood pressure Approach: midline Location: L3-4 Injection technique: single-shot Needle Needle type: Pencan  Needle gauge: 24 G Needle length: 10 cm Assessment Sensory level: T6 Events: CSF return Additional Notes Functioning IV was confirmed and monitors were applied. Sterile prep and drape, including hand hygiene and sterile gloves were used. The patient was positioned and the spine was prepped. The skin was anesthetized with lidocaine.  Free flow of clear CSF was obtained prior to injecting local anesthetic into the CSF.  The spinal needle aspirated freely following injection.  The needle was carefully withdrawn.  The patient tolerated the procedure well.

## 2022-02-10 NOTE — Transfer of Care (Signed)
Immediate Anesthesia Transfer of Care Note  Patient: Amanda Cole  Procedure(s) Performed: RIGHT TOTAL HIP ARTHROPLASTY ANTERIOR APPROACH (Right: Hip)  Patient Location: PACU  Anesthesia Type:General  Level of Consciousness: drowsy  Airway & Oxygen Therapy: Patient Spontanous Breathing and Patient connected to face mask oxygen  Post-op Assessment: Report given to RN and Post -op Vital signs reviewed and stable  Post vital signs: Reviewed and stable  Last Vitals:  Vitals Value Taken Time  BP 125/57 02/10/22 1420  Temp    Pulse 66 02/10/22 1423  Resp 18 02/10/22 1423  SpO2 94 % 02/10/22 1423  Vitals shown include unvalidated device data.  Last Pain:  Vitals:   02/10/22 1045  TempSrc: Oral  PainSc: 4          Complications:  Encounter Notable Events  Notable Event Outcome Phase Comment  Difficult to intubate - unexpected  Intraprocedure Filed from anesthesia note documentation.

## 2022-02-10 NOTE — Plan of Care (Signed)
?  Problem: Activity: ?Goal: Risk for activity intolerance will decrease ?Outcome: Progressing ?  ?Problem: Safety: ?Goal: Ability to remain free from injury will improve ?Outcome: Progressing ?  ?Problem: Pain Managment: ?Goal: General experience of comfort will improve ?Outcome: Progressing ?  ?

## 2022-02-10 NOTE — Op Note (Addendum)
Operative Note  Date of surgery: 02/10/2022 Preoperative diagnosis: Right hip osteoarthritis Postoperative diagnosis: Same  Procedure: Right direct anterior total hip arthroplasty  Implants: DePuy sector GRIPTION acetabular liner size 54, 36+0 polythene liner, size 4 Actis femoral component with standard offset, size 36+5 metal head ball  Surgeon: Lind Guest. Ninfa Linden, MD Assistant: Benita Stabile, PA-C  Anesthesia: #1 attempted spinal, #2 General EBL: 150 cc Antibiotics: 2 g IV Ancef Complications: None  Indications: The patient is an 81 year old female well-known to me.  She unfortunately has debilitating arthritis involving her right hip that is detrimentally affecting her mobility, her quality of life and her actives daily living.  Her pain is daily with that hip.  Her x-rays show severe incision arthritis.  Her pain is significant on exam as well.  At this point with the failure of conservative treatment she wishes to proceed with a hip replacement for her right hip.  I agree with this as well.  We talked about the risk of acute blood loss anemia, nerve or vessel injury, fracture, infection, DVT, dislocation, implant failure, leg length differences in skin and soft tissue wound healing issues.  We talked about her goals being hopefully decrease pain, improve mobility, and improve quality of life.  Procedure description: After informed consent was obtained and appropriate right hip was marked, the patient was brought to the operating room and set up on the stretcher where spinal anesthesia was obtained.  She was then laid in supine position on stretcher.  A Foley catheter was placed and I was able to place traction boots on both her feet.  She was then placed supine on the Hana fracture table with a perineal post in place in both legs and inline skeletal traction devices but no traction applied.  Her right operative hip was prepped and draped in DuraPrep and sterile drapes.  Timeout is  called and she is implants correct patient correct right hip.  I then performed a pinch test on the incision and she felt pain.  Before we made incision they did proceed with general anesthesia given that fact.  We then made incision just inferior and posterior to the anterior superior iliac spine and carried this obliquely down the leg.  I dissected down the tensor fascia lata muscle tensor fascia was then divided longitudinally to proceed with a directed approach to the hip.  Circumflex vessels were identified and cauterized.  The hip capsule was identified and opened up in an L-type format finding a moderate joint effusion.  Cobra retractors were then placed around the medial and lateral femoral neck and a femoral neck cut was made with an oscillating saw just proximal to the lesser trochanter and completed with an osteotome.  A corkscrew guide was placed in the femoral head the femoral head was removed in its entirety and found a wide area devoid of cartilage and flattening of the head.  A bent Hohmann was then placed over the medial acetabular rim and I remove remnants of the acetabular labrum and other debris.  We then began reaming under regularization from a size 43 reamer and stepwise increments going all the way up to size 53 however given the deformity of her acetabulum.  The last reamer was placed under direct fluoroscopy as well so we could obtain our depth reaming, or inclination, and her anteversion.  I then placed the real DePuy sector GRIPTION acetabular component size 54 and 36+0 polyethylene liner for that size 54 acetabular component.  Attention was then turned  to the femur.  With the leg externally rotated to 120 degrees, extended and add ducted, a Mueller retractor was placed medially and a Hohmann retractor was placed behind the greater trochanter.  A box cutting osteotome was used in the femoral canal and broaching was started from a size 0 broach going up to a size 4.  With a size 4 in place  for trialed a standard offset femoral neck and a 36+1.5 hip ball.  The leg was brought back over and up and with traction and internal rotation was reduced in the pelvis.  Based on preoperative assessment and preoperative x-rays combined postoperative x-rays we felt like we needed to go just a little bit longer.  The hip was stable though.  We dislocated the hip and with the trial components.  We placed the real femoral component which was an Actis size 4 with standard offset and the real 36+5 metal hip ball and again reduce this in the acetabulum we are pleased with leg length offset range of motion and stability.  The soft tissue was then irrigated with with normal saline solution.  The joint capsule was closed with interrupted #1 Ethibond suture followed by a #1 Vicryl to close the tensor fascia.  0 Vicryl was used to close the deep tissue and 2-0 Vicryl was used to close the subcutaneous tissue.  The skin was closed with staples.  An Aquacel dressing was applied.  The patient was taken off the Hana table, awakened, extubated and taken to recovery in stable addition with all final counts being correct and no complications noted.  Of note Benita Stabile, PA-C did assist in the heart case from beginning to end and his assistance was medically necessary and crucial for soft tissue management and retraction including helping guide implant placement and a layered closure of the wound.

## 2022-02-10 NOTE — Anesthesia Procedure Notes (Addendum)
Procedure Name: Intubation Date/Time: 02/10/2022 1:16 PM  Performed by: Santa Lighter, MDPre-anesthesia Checklist: Patient identified, Emergency Drugs available, Suction available and Patient being monitored Patient Re-evaluated:Patient Re-evaluated prior to induction Oxygen Delivery Method: Circle system utilized Preoxygenation: Pre-oxygenation with 100% oxygen Induction Type: IV induction Ventilation: Mask ventilation without difficulty Laryngoscope Size: Mac and 3 Grade View: Grade III Tube type: Oral Tube size: 7.0 mm Number of attempts: 1 Airway Equipment and Method: Stylet Placement Confirmation: ETT inserted through vocal cords under direct vision, positive ETCO2 and breath sounds checked- equal and bilateral Secured at: 21 cm Tube secured with: Tape Dental Injury: Teeth and Oropharynx as per pre-operative assessment  Difficulty Due To: Difficulty was unanticipated, Difficult Airway- due to reduced neck mobility and Difficult Airway- due to anterior larynx

## 2022-02-11 DIAGNOSIS — Z79899 Other long term (current) drug therapy: Secondary | ICD-10-CM | POA: Diagnosis not present

## 2022-02-11 DIAGNOSIS — N183 Chronic kidney disease, stage 3 unspecified: Secondary | ICD-10-CM | POA: Diagnosis not present

## 2022-02-11 DIAGNOSIS — M1611 Unilateral primary osteoarthritis, right hip: Secondary | ICD-10-CM | POA: Diagnosis not present

## 2022-02-11 LAB — CBC
HCT: 31.3 % — ABNORMAL LOW (ref 36.0–46.0)
Hemoglobin: 10.3 g/dL — ABNORMAL LOW (ref 12.0–15.0)
MCH: 29.4 pg (ref 26.0–34.0)
MCHC: 32.9 g/dL (ref 30.0–36.0)
MCV: 89.4 fL (ref 80.0–100.0)
Platelets: 176 10*3/uL (ref 150–400)
RBC: 3.5 MIL/uL — ABNORMAL LOW (ref 3.87–5.11)
RDW: 14.2 % (ref 11.5–15.5)
WBC: 5.9 10*3/uL (ref 4.0–10.5)
nRBC: 0 % (ref 0.0–0.2)

## 2022-02-11 LAB — BASIC METABOLIC PANEL
Anion gap: 5 (ref 5–15)
BUN: 16 mg/dL (ref 8–23)
CO2: 25 mmol/L (ref 22–32)
Calcium: 8.3 mg/dL — ABNORMAL LOW (ref 8.9–10.3)
Chloride: 108 mmol/L (ref 98–111)
Creatinine, Ser: 0.96 mg/dL (ref 0.44–1.00)
GFR, Estimated: 59 mL/min — ABNORMAL LOW (ref >60)
Glucose, Bld: 111 mg/dL — ABNORMAL HIGH (ref 70–99)
Potassium: 3.9 mmol/L (ref 3.5–5.1)
Sodium: 138 mmol/L (ref 135–145)

## 2022-02-11 MED ORDER — METHOCARBAMOL 500 MG PO TABS: 500.0000 mg | ORAL_TABLET | Freq: Four times a day (QID) | ORAL | 1 refills | Status: DC | PRN

## 2022-02-11 MED ORDER — HYDROCODONE-ACETAMINOPHEN 5-325 MG PO TABS: 1.0000 | ORAL_TABLET | Freq: Four times a day (QID) | ORAL | 0 refills | Status: DC | PRN

## 2022-02-11 MED ORDER — ONDANSETRON 4 MG PO TBDP: 4.0000 mg | ORAL_TABLET | Freq: Three times a day (TID) | ORAL | 0 refills | Status: DC | PRN

## 2022-02-11 NOTE — Progress Notes (Signed)
The patient is alert and oriented and has been seen by her physician. The orders for discharge were written. IV has been removed. Went over discharge instructions with patient and family. She is being discharged via wheelchair with all of her belongings.  

## 2022-02-11 NOTE — Progress Notes (Signed)
Subjective: 1 Day Post-Op Procedure(s) (LRB): RIGHT TOTAL HIP ARTHROPLASTY ANTERIOR APPROACH (Right) Patient reports pain as moderate.  Nausea last evening.  Morning session with PT went well.  Objective: Vital signs in last 24 hours: Temp:  [97.7 F (36.5 C)-98.9 F (37.2 C)] 98 F (36.7 C) (09/30 1025) Pulse Rate:  [62-83] 73 (09/30 1025) Resp:  [11-20] 18 (09/30 1025) BP: (108-146)/(45-75) 111/63 (09/30 1025) SpO2:  [94 %-100 %] 94 % (09/30 1025)  Intake/Output from previous day: 09/29 0701 - 09/30 0700 In: 3053.8 [P.O.:450; I.V.:2403.8; IV Piggyback:200] Out: 1400 [Urine:1250; Blood:150] Intake/Output this shift: Total I/O In: 411.9 [P.O.:360; I.V.:51.9] Out: 300 [Urine:300]  Recent Labs    02/11/22 0405  HGB 10.3*   Recent Labs    02/11/22 0405  WBC 5.9  RBC 3.50*  HCT 31.3*  PLT 176   Recent Labs    02/11/22 0405  NA 138  K 3.9  CL 108  CO2 25  BUN 16  CREATININE 0.96  GLUCOSE 111*  CALCIUM 8.3*   No results for input(s): "LABPT", "INR" in the last 72 hours.  Sensation intact distally Intact pulses distally Dorsiflexion/Plantar flexion intact Incision: dressing C/D/I   Assessment/Plan: 1 Day Post-Op Procedure(s) (LRB): RIGHT TOTAL HIP ARTHROPLASTY ANTERIOR APPROACH (Right) Up with therapy Discharge home with home health this afternoon if doing well.      [name] 02/11/2022, 10:51 AM

## 2022-02-11 NOTE — Progress Notes (Signed)
Physical Therapy Treatment Patient Details Name: Amanda Cole MRN: [id number] DOB: [birth year] Today's Date: 02/11/2022   History of Present Illness Pt is an 81yo female presenting s/p R-THA, AA on 02/10/22. PMH: CKD, low back pain, bilateral hand pain, hard of hearing    PT Comments    Pt progressing well. No incr pain. Reviewed areas below, transfer safety. Pt did well with stair training, no LOB. Dtr present for session   Recommendations for follow up therapy are one component of a multi-disciplinary discharge planning process, led by the attending physician.  Recommendations may be updated based on patient status, additional functional criteria and insurance authorization.  Follow Up Recommendations  Follow physician's recommendations for discharge plan and follow up therapies     Assistance Recommended at Discharge Frequent or constant Supervision/Assistance  Patient can return home with the following A little help with walking and/or transfers;A little help with bathing/dressing/bathroom;Assistance with cooking/housework;Assist for transportation;Help with stairs or ramp for entrance   Equipment Recommendations  Rolling walker (2 wheels);BSC/3in1    Recommendations for Other Services       Precautions / Restrictions Precautions Precautions: Fall Restrictions Weight Bearing Restrictions: No Other Position/Activity Restrictions: wbat     Mobility  Bed Mobility               General bed mobility comments: EOB    Transfers Overall transfer level: Needs assistance Equipment used: Rolling walker (2 wheels) Transfers: Sit to/from Stand Sit to Stand: Min guard, Supervision           General transfer comment: cues for hand placement; supervision from [facility], reclienr; STS repeated x 4    Ambulation/Gait Ambulation/Gait assistance: Min guard, Supervision Gait Distance (Feet): 100 Feet (80') Assistive device: Rolling walker (2 wheels) Gait Pattern/deviations:  Step-to pattern       General Gait Details: cues for sequence, RW position and to externally rotate/bring R hip to neutral and avoid mild scissoring RLE. no overt LOB. min/guard to supervision for safety   Stairs Stairs: Yes Stairs assistance: Min guard Stair Management: Step to pattern, Sideways, One rail Right, One rail Left Number of Stairs: 3 General stair comments: cues for technique with one handrail and sequence   Wheelchair Mobility    Modified Rankin (Stroke Patients Only)       Balance                                            Cognition Arousal/Alertness: Awake/alert Behavior During Therapy: WFL for tasks assessed/performed Overall Cognitive Status: Within Functional Limits for tasks assessed                                          Exercises Total Joint Exercises Ankle Circles/Pumps: AROM, Both, 20 reps Quad Sets:  (reviewed HEP, handout)    General Comments        Pertinent Vitals/Pain Pain Assessment Pain Assessment: Faces Faces Pain Scale: Hurts little more Pain Location: right hip Pain Descriptors / Indicators: Operative site guarding, Discomfort, Grimacing Pain Intervention(s): Limited activity within patient's tolerance, Monitored during session, Premedicated before session, Repositioned    Home Living                          Prior Function  PT Goals (current goals can now be found in the care plan section) Acute Rehab PT Goals Patient Stated Goal: Walking peacefully and without pain PT Goal Formulation: With patient Time For Goal Achievement: 02/17/22 Potential to Achieve Goals: Good Progress towards PT goals: Progressing toward goals    Frequency    7X/week      PT Plan Current plan remains appropriate    Co-evaluation              AM-PAC PT "6 Clicks" Mobility   Outcome Measure  Help needed turning from your back to your side while in a flat bed without  using bedrails?: A Little Help needed moving from lying on your back to sitting on the side of a flat bed without using bedrails?: A Little Help needed moving to and from a bed to a chair (including a wheelchair)?: A Little Help needed standing up from a chair using your arms (e.g., wheelchair or bedside chair)?: A Little Help needed to walk in hospital room?: A Little Help needed climbing 3-5 steps with a railing? : A Little 6 Click Score: 18    End of Session Equipment Utilized During Treatment: Gait belt Activity Tolerance: Patient tolerated treatment well Patient left: with call bell/phone within reach;with family/visitor present;Other (comment) (EOB) Nurse Communication: Mobility status PT Visit Diagnosis: Pain;Difficulty in walking, not elsewhere classified (R26.2);History of falling (Z91.81) Pain - Right/Left: Right Pain - part of body: Hip     Time: 1343-1410 PT Time Calculation (min) (ACUTE ONLY): 27 min  Charges:  $Gait Training: 23-37 mins                     Ritta Hammes, PT  Acute Rehab Dept [facility]) [phone]  [facility] Weekend Pager [facility] only)  [phone]  02/11/2022    [facility] 02/11/2022, 2:15 PM

## 2022-02-11 NOTE — Progress Notes (Signed)
Physical Therapy Treatment Patient Details Name: GLYNDA SOLIDAY MRN: [id number] DOB: [birth year] Today's Date: 02/11/2022   History of Present Illness Pt is an 81yo female presenting s/p R-THA, AA on 02/10/22. PMH: CKD, low back pain, bilateral hand pain, hard of hearing    PT Comments    Pt is progressing well. Had issues with pain and nausea earlier am which hav mostly resolved. Amb with supervision and RW x 90' . Tol well. Will see again in pm. Pt is hopeful to d/c later today  Recommendations for follow up therapy are one component of a multi-disciplinary discharge planning process, led by the attending physician.  Recommendations may be updated based on patient status, additional functional criteria and insurance authorization.  Follow Up Recommendations  Follow physician's recommendations for discharge plan and follow up therapies     Assistance Recommended at Discharge Frequent or constant Supervision/Assistance  Patient can return home with the following A little help with walking and/or transfers;A little help with bathing/dressing/bathroom;Assistance with cooking/housework;Assist for transportation;Help with stairs or ramp for entrance   Equipment Recommendations  Rolling walker (2 wheels);BSC/3in1    Recommendations for Other Services       Precautions / Restrictions Precautions Precautions: Fall Restrictions Weight Bearing Restrictions: No Other Position/Activity Restrictions: wbat     Mobility  Bed Mobility               General bed mobility comments: in recliner    Transfers Overall transfer level: Needs assistance Equipment used: Rolling walker (2 wheels) Transfers: Sit to/from Stand Sit to Stand: Min guard, Supervision           General transfer comment: cues for hand placement    Ambulation/Gait Ambulation/Gait assistance: Min guard, Supervision Gait Distance (Feet): 90 Feet Assistive device: Rolling walker (2 wheels) Gait  Pattern/deviations: Step-to pattern       General Gait Details: cues for sequence, RW position and to externally rotate/bring hip to neutral and avoid mild scissoring RLE.   Stairs             Wheelchair Mobility    Modified Rankin (Stroke Patients Only)       Balance                                            Cognition Arousal/Alertness: Awake/alert Behavior During Therapy: WFL for tasks assessed/performed Overall Cognitive Status: Within Functional Limits for tasks assessed                                          Exercises Total Joint Exercises Ankle Circles/Pumps: AROM, Both, 20 reps Quad Sets: AROM, Both, 10 reps    General Comments        Pertinent Vitals/Pain Pain Assessment Pain Assessment: Faces Faces Pain Scale: Hurts little more Pain Location: right hip Pain Descriptors / Indicators: Operative site guarding, Discomfort, Grimacing Pain Intervention(s): Limited activity within patient's tolerance, Monitored during session, Repositioned, Premedicated before session, Ice applied    Home Living                          Prior Function            PT Goals (current goals can now be found in the care plan section)  Acute Rehab PT Goals Patient Stated Goal: Walking peacefully and without pain PT Goal Formulation: With patient Time For Goal Achievement: 02/17/22 Potential to Achieve Goals: Good Progress towards PT goals: Progressing toward goals    Frequency    7X/week      PT Plan Current plan remains appropriate    Co-evaluation              AM-PAC PT "6 Clicks" Mobility   Outcome Measure  Help needed turning from your back to your side while in a flat bed without using bedrails?: A Little Help needed moving from lying on your back to sitting on the side of a flat bed without using bedrails?: A Little Help needed moving to and from a bed to a chair (including a wheelchair)?: A  Little Help needed standing up from a chair using your arms (e.g., wheelchair or bedside chair)?: A Little Help needed to walk in hospital room?: A Little Help needed climbing 3-5 steps with a railing? : A Little 6 Click Score: 18    End of Session Equipment Utilized During Treatment: Gait belt Activity Tolerance: Patient tolerated treatment well Patient left: in chair;with call bell/phone within reach;with family/visitor present Nurse Communication: Mobility status PT Visit Diagnosis: Pain;Difficulty in walking, not elsewhere classified (R26.2);History of falling (Z91.81) Pain - Right/Left: Right Pain - part of body: Hip     Time: 5625-6389 PT Time Calculation (min) (ACUTE ONLY): 21 min  Charges:  $Gait Training: 8-22 mins                     Baxter Flattery, PT  Acute Rehab Dept [facility]) [phone]  [facility] Weekend Pager [facility] only)  [phone]  02/11/2022    [facility] 02/11/2022, 10:50 AM

## 2022-02-11 NOTE — Plan of Care (Signed)
  Problem: Education: Goal: Knowledge of General Education information will improve Description Including pain rating scale, medication(s)/side effects and non-pharmacologic comfort measures Outcome: Progressing   

## 2022-02-11 NOTE — Discharge Instructions (Signed)

## 2022-02-12 DIAGNOSIS — J309 Allergic rhinitis, unspecified: Secondary | ICD-10-CM | POA: Diagnosis not present

## 2022-02-12 DIAGNOSIS — Z96641 Presence of right artificial hip joint: Secondary | ICD-10-CM | POA: Diagnosis not present

## 2022-02-12 DIAGNOSIS — G8929 Other chronic pain: Secondary | ICD-10-CM | POA: Diagnosis not present

## 2022-02-12 DIAGNOSIS — N183 Chronic kidney disease, stage 3 unspecified: Secondary | ICD-10-CM | POA: Diagnosis not present

## 2022-02-12 DIAGNOSIS — E538 Deficiency of other specified B group vitamins: Secondary | ICD-10-CM | POA: Diagnosis not present

## 2022-02-12 DIAGNOSIS — G479 Sleep disorder, unspecified: Secondary | ICD-10-CM | POA: Diagnosis not present

## 2022-02-12 DIAGNOSIS — Z9181 History of falling: Secondary | ICD-10-CM | POA: Diagnosis not present

## 2022-02-12 DIAGNOSIS — M069 Rheumatoid arthritis, unspecified: Secondary | ICD-10-CM | POA: Diagnosis not present

## 2022-02-12 DIAGNOSIS — Z471 Aftercare following joint replacement surgery: Secondary | ICD-10-CM | POA: Diagnosis not present

## 2022-02-12 DIAGNOSIS — Z87440 Personal history of urinary (tract) infections: Secondary | ICD-10-CM | POA: Diagnosis not present

## 2022-02-12 DIAGNOSIS — H9190 Unspecified hearing loss, unspecified ear: Secondary | ICD-10-CM | POA: Diagnosis not present

## 2022-02-12 DIAGNOSIS — E785 Hyperlipidemia, unspecified: Secondary | ICD-10-CM | POA: Diagnosis not present

## 2022-02-12 DIAGNOSIS — Z7982 Long term (current) use of aspirin: Secondary | ICD-10-CM | POA: Diagnosis not present

## 2022-02-12 DIAGNOSIS — Z993 Dependence on wheelchair: Secondary | ICD-10-CM | POA: Diagnosis not present

## 2022-02-12 NOTE — Discharge Summary (Signed)
Patient ID: Amanda Cole MRN: [id number] DOB/AGE: [birth year] 83 y.o.  Admit date: 02/10/2022 Discharge date: 02/11/2022  Admission Diagnoses:  Principal Problem:   Unilateral primary osteoarthritis, right hip Active Problems:   Status post total replacement of right hip   Discharge Diagnoses:  Same  Past Medical History:  Diagnosis Date   Arthritis    Cataract    Chronic kidney disease     Surgeries: Procedure(s): RIGHT TOTAL HIP ARTHROPLASTY ANTERIOR APPROACH on 02/10/2022   Consultants:   Discharged Condition: Improved  Hospital Course: Amanda Cole is an 81 y.o. female who was admitted 02/10/2022 for operative treatment ofUnilateral primary osteoarthritis, right hip. Patient has severe unremitting pain that affects sleep, daily activities, and work/hobbies. After pre-op clearance the patient was taken to the operating room on 02/10/2022 and underwent  Procedure(s): RIGHT TOTAL HIP ARTHROPLASTY ANTERIOR APPROACH.    Patient was given perioperative antibiotics:  Anti-infectives (From admission, onward)    Start     Dose/Rate Route Frequency Ordered Stop   02/10/22 1900  ceFAZolin (ANCEF) IVPB 1 g/50 mL premix        1 g 100 mL/hr over 30 Minutes Intravenous Every 6 hours 02/10/22 1600 02/11/22 0744   02/10/22 1100  ceFAZolin (ANCEF) IVPB 2g/100 mL premix        2 g 200 mL/hr over 30 Minutes Intravenous On call to O.R. 02/10/22 1049 02/10/22 1254        Patient was given sequential compression devices, early ambulation, and chemoprophylaxis to prevent DVT.  Patient benefited maximally from hospital stay and there were no complications.    Recent vital signs: Patient Vitals for the past 24 hrs:  BP Temp Temp src Pulse Resp SpO2  02/11/22 1025 111/63 98 F (36.7 C) Oral 73 18 94 %     Recent laboratory studies:  Recent Labs    02/11/22 0405  WBC 5.9  HGB 10.3*  HCT 31.3*  PLT 176  NA 138  K 3.9  CL 108  CO2 25  BUN 16  CREATININE 0.96  GLUCOSE  111*  CALCIUM 8.3*     Discharge Medications:   Allergies as of 02/11/2022       Reactions   Oxycontin [oxycodone] Palpitations   Took oxycontin felt like " having a heart attack"   Nitrofurantoin    REACTION: rash   Tetracycline    REACTION: rash        Medication List     TAKE these medications    HYDROcodone-acetaminophen 5-325 MG tablet Commonly known as: NORCO/VICODIN Take 1-2 tablets by mouth every 6 (six) hours as needed for severe pain. What changed: how much to take   methocarbamol 500 MG tablet Commonly known as: ROBAXIN Take 1 tablet (500 mg total) by mouth every 6 (six) hours as needed for muscle spasms.   ondansetron 4 MG disintegrating tablet Commonly known as: ZOFRAN-ODT Take 1 tablet (4 mg total) by mouth every 8 (eight) hours as needed for nausea or vomiting.   ONE-A-DAY 50 PLUS PO Take 1 tablet by mouth daily.   Premarin vaginal cream Generic drug: conjugated estrogens Use PV as directed What changed:  how much to take how to take this when to take this reasons to take this   Vitamin D3 50 MCG (2000 UT) capsule Take 1 capsule (2,000 Units total) by mouth daily.        Diagnostic Studies: DG Pelvis Portable  Result Date: 02/10/2022 CLINICAL DATA:  Status post right hip replacement  EXAM: PORTABLE PELVIS 1-2 VIEWS COMPARISON:  06/01/2021, without report FINDINGS: Single AP view of the pelvis demonstrates interval right hip arthroplasty. No hardware complication. Expected subcutaneous edema about the operative site. The left femoral head is located with mild to moderate joint space narrowing. Sacroiliac joints are symmetric. Probable phleboliths in the pelvis. IMPRESSION: Expected appearance after right hip arthroplasty. Electronically Signed   By: Abigail Miyamoto M.D.   On: 02/10/2022 15:53   DG HIP PORT UNILAT WITH PELVIS 1V RIGHT  Result Date: 02/10/2022 CLINICAL DATA:  Right total hip arthroplasty EXAM: DG HIP (WITH OR WITHOUT PELVIS) 1V  PORT RIGHT; DG C-ARM 1-60 MIN-NO REPORT COMPARISON:  08/06/2018 hip radiograph FLUOROSCOPY TIME:  Radiation Exposure Index (if provided by the fluoroscopic device): 0.9 mGy FINDINGS: Multiple spot fluoroscopic nondiagnostic intraoperative right hip radiographs demonstrate expected postsurgical changes from right total hip arthroplasty with no evidence of hip malalignment. IMPRESSION: Intraoperative fluoroscopic guidance for right total hip arthroplasty. Electronically Signed   By: Ilona Sorrel M.D.   On: 02/10/2022 14:02   DG C-Arm 1-60 Min-No Report  Result Date: 02/10/2022 Fluoroscopy was utilized by the requesting physician.  No radiographic interpretation.    Disposition: Discharge disposition: 01-Home or [place] Follow up.   Specialty: [place] Why: to provide home physical therapy visits Contact information: [address] [place] [address] [phone]         [name], MD Follow up in 2 week(s).   Specialty: Orthopedic Surgery Contact information: [address] [place] [address] [phone]                  Signed: [name] 02/12/2022, 9:57 AM

## 2022-02-13 ENCOUNTER — Encounter (HOSPITAL_COMMUNITY): Payer: Self-pay | Admitting: Orthopaedic Surgery

## 2022-02-13 ENCOUNTER — Telehealth: Payer: Self-pay | Admitting: *Deleted

## 2022-02-13 NOTE — Telephone Encounter (Signed)
Ortho bundle D/C call completed. 

## 2022-02-14 DIAGNOSIS — E785 Hyperlipidemia, unspecified: Secondary | ICD-10-CM | POA: Diagnosis not present

## 2022-02-14 DIAGNOSIS — H9190 Unspecified hearing loss, unspecified ear: Secondary | ICD-10-CM | POA: Diagnosis not present

## 2022-02-14 DIAGNOSIS — Z993 Dependence on wheelchair: Secondary | ICD-10-CM | POA: Diagnosis not present

## 2022-02-14 DIAGNOSIS — E538 Deficiency of other specified B group vitamins: Secondary | ICD-10-CM | POA: Diagnosis not present

## 2022-02-14 DIAGNOSIS — Z7982 Long term (current) use of aspirin: Secondary | ICD-10-CM | POA: Diagnosis not present

## 2022-02-14 DIAGNOSIS — N183 Chronic kidney disease, stage 3 unspecified: Secondary | ICD-10-CM | POA: Diagnosis not present

## 2022-02-14 DIAGNOSIS — Z9181 History of falling: Secondary | ICD-10-CM | POA: Diagnosis not present

## 2022-02-14 DIAGNOSIS — Z471 Aftercare following joint replacement surgery: Secondary | ICD-10-CM | POA: Diagnosis not present

## 2022-02-14 DIAGNOSIS — M069 Rheumatoid arthritis, unspecified: Secondary | ICD-10-CM | POA: Diagnosis not present

## 2022-02-14 DIAGNOSIS — J309 Allergic rhinitis, unspecified: Secondary | ICD-10-CM | POA: Diagnosis not present

## 2022-02-14 DIAGNOSIS — Z96641 Presence of right artificial hip joint: Secondary | ICD-10-CM | POA: Diagnosis not present

## 2022-02-14 DIAGNOSIS — Z87440 Personal history of urinary (tract) infections: Secondary | ICD-10-CM | POA: Diagnosis not present

## 2022-02-14 DIAGNOSIS — G479 Sleep disorder, unspecified: Secondary | ICD-10-CM | POA: Diagnosis not present

## 2022-02-14 DIAGNOSIS — G8929 Other chronic pain: Secondary | ICD-10-CM | POA: Diagnosis not present

## 2022-02-16 ENCOUNTER — Encounter: Payer: Medicare Other | Admitting: Orthopaedic Surgery

## 2022-02-16 DIAGNOSIS — E785 Hyperlipidemia, unspecified: Secondary | ICD-10-CM | POA: Diagnosis not present

## 2022-02-16 DIAGNOSIS — Z471 Aftercare following joint replacement surgery: Secondary | ICD-10-CM | POA: Diagnosis not present

## 2022-02-16 DIAGNOSIS — Z9181 History of falling: Secondary | ICD-10-CM | POA: Diagnosis not present

## 2022-02-16 DIAGNOSIS — N183 Chronic kidney disease, stage 3 unspecified: Secondary | ICD-10-CM | POA: Diagnosis not present

## 2022-02-16 DIAGNOSIS — E538 Deficiency of other specified B group vitamins: Secondary | ICD-10-CM | POA: Diagnosis not present

## 2022-02-16 DIAGNOSIS — Z993 Dependence on wheelchair: Secondary | ICD-10-CM | POA: Diagnosis not present

## 2022-02-16 DIAGNOSIS — J309 Allergic rhinitis, unspecified: Secondary | ICD-10-CM | POA: Diagnosis not present

## 2022-02-16 DIAGNOSIS — Z7982 Long term (current) use of aspirin: Secondary | ICD-10-CM | POA: Diagnosis not present

## 2022-02-16 DIAGNOSIS — M069 Rheumatoid arthritis, unspecified: Secondary | ICD-10-CM | POA: Diagnosis not present

## 2022-02-16 DIAGNOSIS — H9190 Unspecified hearing loss, unspecified ear: Secondary | ICD-10-CM | POA: Diagnosis not present

## 2022-02-16 DIAGNOSIS — Z96641 Presence of right artificial hip joint: Secondary | ICD-10-CM | POA: Diagnosis not present

## 2022-02-16 DIAGNOSIS — Z87440 Personal history of urinary (tract) infections: Secondary | ICD-10-CM | POA: Diagnosis not present

## 2022-02-16 DIAGNOSIS — G479 Sleep disorder, unspecified: Secondary | ICD-10-CM | POA: Diagnosis not present

## 2022-02-16 DIAGNOSIS — G8929 Other chronic pain: Secondary | ICD-10-CM | POA: Diagnosis not present

## 2022-02-17 ENCOUNTER — Telehealth: Payer: Self-pay | Admitting: *Deleted

## 2022-02-17 NOTE — Telephone Encounter (Signed)
Ortho bundle 7 day call completed. 

## 2022-02-20 ENCOUNTER — Telehealth: Payer: Self-pay | Admitting: Orthopaedic Surgery

## 2022-02-20 ENCOUNTER — Other Ambulatory Visit: Payer: Self-pay | Admitting: Physician Assistant

## 2022-02-20 DIAGNOSIS — G8929 Other chronic pain: Secondary | ICD-10-CM | POA: Diagnosis not present

## 2022-02-20 DIAGNOSIS — E538 Deficiency of other specified B group vitamins: Secondary | ICD-10-CM | POA: Diagnosis not present

## 2022-02-20 DIAGNOSIS — E785 Hyperlipidemia, unspecified: Secondary | ICD-10-CM | POA: Diagnosis not present

## 2022-02-20 DIAGNOSIS — J309 Allergic rhinitis, unspecified: Secondary | ICD-10-CM | POA: Diagnosis not present

## 2022-02-20 DIAGNOSIS — Z96641 Presence of right artificial hip joint: Secondary | ICD-10-CM | POA: Diagnosis not present

## 2022-02-20 DIAGNOSIS — N183 Chronic kidney disease, stage 3 unspecified: Secondary | ICD-10-CM | POA: Diagnosis not present

## 2022-02-20 DIAGNOSIS — M069 Rheumatoid arthritis, unspecified: Secondary | ICD-10-CM | POA: Diagnosis not present

## 2022-02-20 DIAGNOSIS — Z87440 Personal history of urinary (tract) infections: Secondary | ICD-10-CM | POA: Diagnosis not present

## 2022-02-20 DIAGNOSIS — Z7982 Long term (current) use of aspirin: Secondary | ICD-10-CM | POA: Diagnosis not present

## 2022-02-20 DIAGNOSIS — Z993 Dependence on wheelchair: Secondary | ICD-10-CM | POA: Diagnosis not present

## 2022-02-20 DIAGNOSIS — H9190 Unspecified hearing loss, unspecified ear: Secondary | ICD-10-CM | POA: Diagnosis not present

## 2022-02-20 DIAGNOSIS — Z9181 History of falling: Secondary | ICD-10-CM | POA: Diagnosis not present

## 2022-02-20 DIAGNOSIS — G479 Sleep disorder, unspecified: Secondary | ICD-10-CM | POA: Diagnosis not present

## 2022-02-20 DIAGNOSIS — Z471 Aftercare following joint replacement surgery: Secondary | ICD-10-CM | POA: Diagnosis not present

## 2022-02-20 MED ORDER — HYDROCODONE-ACETAMINOPHEN 5-325 MG PO TABS: 1.0000 | ORAL_TABLET | Freq: Four times a day (QID) | ORAL | 0 refills | Status: DC | PRN

## 2022-02-20 NOTE — Telephone Encounter (Signed)
Pt called requesting a refill of hydrocodone be sent to [facility]. Pt states PLEASE DO NOT SEND MEDS TO [facility]. Pt phone number is [phone]

## 2022-02-22 DIAGNOSIS — Z87440 Personal history of urinary (tract) infections: Secondary | ICD-10-CM | POA: Diagnosis not present

## 2022-02-22 DIAGNOSIS — Z96641 Presence of right artificial hip joint: Secondary | ICD-10-CM | POA: Diagnosis not present

## 2022-02-22 DIAGNOSIS — E538 Deficiency of other specified B group vitamins: Secondary | ICD-10-CM | POA: Diagnosis not present

## 2022-02-22 DIAGNOSIS — J309 Allergic rhinitis, unspecified: Secondary | ICD-10-CM | POA: Diagnosis not present

## 2022-02-22 DIAGNOSIS — H9190 Unspecified hearing loss, unspecified ear: Secondary | ICD-10-CM | POA: Diagnosis not present

## 2022-02-22 DIAGNOSIS — Z7982 Long term (current) use of aspirin: Secondary | ICD-10-CM | POA: Diagnosis not present

## 2022-02-22 DIAGNOSIS — N183 Chronic kidney disease, stage 3 unspecified: Secondary | ICD-10-CM | POA: Diagnosis not present

## 2022-02-22 DIAGNOSIS — E785 Hyperlipidemia, unspecified: Secondary | ICD-10-CM | POA: Diagnosis not present

## 2022-02-22 DIAGNOSIS — M069 Rheumatoid arthritis, unspecified: Secondary | ICD-10-CM | POA: Diagnosis not present

## 2022-02-22 DIAGNOSIS — G479 Sleep disorder, unspecified: Secondary | ICD-10-CM | POA: Diagnosis not present

## 2022-02-22 DIAGNOSIS — Z993 Dependence on wheelchair: Secondary | ICD-10-CM | POA: Diagnosis not present

## 2022-02-22 DIAGNOSIS — G8929 Other chronic pain: Secondary | ICD-10-CM | POA: Diagnosis not present

## 2022-02-22 DIAGNOSIS — Z471 Aftercare following joint replacement surgery: Secondary | ICD-10-CM | POA: Diagnosis not present

## 2022-02-22 DIAGNOSIS — Z9181 History of falling: Secondary | ICD-10-CM | POA: Diagnosis not present

## 2022-02-23 ENCOUNTER — Encounter: Payer: Self-pay | Admitting: Orthopaedic Surgery

## 2022-02-23 ENCOUNTER — Telehealth: Payer: Self-pay | Admitting: *Deleted

## 2022-02-23 ENCOUNTER — Ambulatory Visit (INDEPENDENT_AMBULATORY_CARE_PROVIDER_SITE_OTHER): Payer: Medicare Other | Admitting: Orthopaedic Surgery

## 2022-02-23 DIAGNOSIS — Z96641 Presence of right artificial hip joint: Secondary | ICD-10-CM

## 2022-02-23 NOTE — Progress Notes (Signed)
The patient is here for first visit status post a right total hip arthroplasty.  She is an active 81 year old female.  She says she is doing okay.  She is needing cane to ambulate and is feeling significant weakness in her right hip.  She has been taking pain medication and got a refill the other day.  She has been compliant with her baby aspirin twice a day.  Her right hip incision looks good.  The staples were then removed and Steri-Strips applied.  Her leg lengths are equal.  She has moderate swelling but no significant seroma to drain.  She will continue to increase her activities as comfort allows.  We will see her back in 4 weeks to see how she is doing overall but no x-rays are needed.  All questions and concerns were answered and addressed.

## 2022-02-23 NOTE — Telephone Encounter (Signed)
Ortho bundle 14 day in office meeting completed. °

## 2022-02-24 DIAGNOSIS — G8929 Other chronic pain: Secondary | ICD-10-CM | POA: Diagnosis not present

## 2022-02-24 DIAGNOSIS — Z993 Dependence on wheelchair: Secondary | ICD-10-CM | POA: Diagnosis not present

## 2022-02-24 DIAGNOSIS — H9190 Unspecified hearing loss, unspecified ear: Secondary | ICD-10-CM | POA: Diagnosis not present

## 2022-02-24 DIAGNOSIS — Z7982 Long term (current) use of aspirin: Secondary | ICD-10-CM | POA: Diagnosis not present

## 2022-02-24 DIAGNOSIS — N183 Chronic kidney disease, stage 3 unspecified: Secondary | ICD-10-CM | POA: Diagnosis not present

## 2022-02-24 DIAGNOSIS — Z96641 Presence of right artificial hip joint: Secondary | ICD-10-CM | POA: Diagnosis not present

## 2022-02-24 DIAGNOSIS — J309 Allergic rhinitis, unspecified: Secondary | ICD-10-CM | POA: Diagnosis not present

## 2022-02-24 DIAGNOSIS — G479 Sleep disorder, unspecified: Secondary | ICD-10-CM | POA: Diagnosis not present

## 2022-02-24 DIAGNOSIS — E538 Deficiency of other specified B group vitamins: Secondary | ICD-10-CM | POA: Diagnosis not present

## 2022-02-24 DIAGNOSIS — E785 Hyperlipidemia, unspecified: Secondary | ICD-10-CM | POA: Diagnosis not present

## 2022-02-24 DIAGNOSIS — M069 Rheumatoid arthritis, unspecified: Secondary | ICD-10-CM | POA: Diagnosis not present

## 2022-02-24 DIAGNOSIS — Z87440 Personal history of urinary (tract) infections: Secondary | ICD-10-CM | POA: Diagnosis not present

## 2022-02-24 DIAGNOSIS — Z471 Aftercare following joint replacement surgery: Secondary | ICD-10-CM | POA: Diagnosis not present

## 2022-02-24 DIAGNOSIS — Z9181 History of falling: Secondary | ICD-10-CM | POA: Diagnosis not present

## 2022-03-13 ENCOUNTER — Telehealth: Payer: Self-pay | Admitting: *Deleted

## 2022-03-13 ENCOUNTER — Ambulatory Visit: Payer: Medicare Other | Admitting: Physician Assistant

## 2022-03-13 ENCOUNTER — Ambulatory Visit (INDEPENDENT_AMBULATORY_CARE_PROVIDER_SITE_OTHER): Payer: Medicare Other

## 2022-03-13 ENCOUNTER — Encounter: Payer: Self-pay | Admitting: Physician Assistant

## 2022-03-13 DIAGNOSIS — Z96641 Presence of right artificial hip joint: Secondary | ICD-10-CM

## 2022-03-13 NOTE — Progress Notes (Signed)
HPI: Amanda Cole comes in today status post right total hip arthroplasty 02/10/2022.  She states she was moving in bed last week and felt a pop.  She states she is having more groin pain difficulty raising her right leg.  She is using a cane to ambulate.  At last visit she did feel that the leg was weak and was using a cane also.  She denies fevers chills.  Denies any wound healing problems drainage.  Review of systems: See HPI otherwise negative  Physical exam: General well-developed well-nourished female who walks with a slight antalgic gait but is able to get on and off the exam table on her own. Right hip: Fluid motion pain with internal rotation.  Significant tenderness over the trochanteric region and down the IT band.  Able to flex her hip up.  Radiographs: AP pelvis lateral view of the right hip: No acute fracture.  No hardware failure.  Hips well located.  No change in overall position alignment of components compared to the postop film.  Impression: Right hip pain Status post right total hip arthroplasty 02/10/2022.  Plan: Like for her to stop doing the abduction and marching exercises she was doing.  Have her work on IT band stretching as shown.  She will walk for exercise.  We will see her back in 2 weeks see how she is doing overall.  Questions were encouraged and answered at length today.

## 2022-03-13 NOTE — Telephone Encounter (Signed)
Call today from patient; states she was lying in bed and moved to change position and made a really big movement with her Right hip and she felt it "come out of place". She can't remember if she felt a pop, but was able to stand and walk, but has had to ice and using walker again. Wants to make sure everything is ok. She is 4 weeks post op. Appointment with Artis Delay today at 10:15. Thanks.

## 2022-03-20 ENCOUNTER — Encounter: Payer: Medicare Other | Admitting: Physician Assistant

## 2022-03-23 ENCOUNTER — Encounter: Payer: Medicare Other | Admitting: Orthopaedic Surgery

## 2022-03-30 ENCOUNTER — Ambulatory Visit (INDEPENDENT_AMBULATORY_CARE_PROVIDER_SITE_OTHER): Payer: Medicare Other | Admitting: Physician Assistant

## 2022-03-30 ENCOUNTER — Encounter: Payer: Self-pay | Admitting: Physician Assistant

## 2022-03-30 DIAGNOSIS — Z96641 Presence of right artificial hip joint: Secondary | ICD-10-CM

## 2022-03-30 NOTE — Progress Notes (Signed)
HPI: Amanda Cole returns today status post right total hip arthroplasty 02/10/2022.  She states overall she is doing well.  She still has 7 out of 10 pain at worst about the right hip.  She is taking no medications.  She states that the pain is becoming less frequent.  She notes stiffness when she first begins to ambulate.  She does go without a cane in the house but carries a cane whenever she is out of the house.  She has had no additional sensation of the hip popping.  She states that the hip incision is well-healed.  Physical exam: General well-developed well-nourished female no acute distress.  Affect appropriate.  Carries a cane when ambulating.  Right hip: Good range of motion in the right hip without pain.  Dorsiflexion plantarflexion right ankle intact.  Impression: Status post right total hip arthroplasty 02/10/2022  Plan: She will continue work on range of motion strengthening.  Stated that she is still having some pain in the hip and some stiffness recommend she follow-up with Dr. Ninfa Linden in 4 weeks just for 1 final visit during her postoperative period already talked about.

## 2022-04-03 DIAGNOSIS — D225 Melanocytic nevi of trunk: Secondary | ICD-10-CM | POA: Diagnosis not present

## 2022-04-03 DIAGNOSIS — C44319 Basal cell carcinoma of skin of other parts of face: Secondary | ICD-10-CM | POA: Diagnosis not present

## 2022-04-03 DIAGNOSIS — L820 Inflamed seborrheic keratosis: Secondary | ICD-10-CM | POA: Diagnosis not present

## 2022-04-03 DIAGNOSIS — Z1283 Encounter for screening for malignant neoplasm of skin: Secondary | ICD-10-CM | POA: Diagnosis not present

## 2022-04-24 ENCOUNTER — Telehealth: Payer: Self-pay | Admitting: Internal Medicine

## 2022-04-24 NOTE — Telephone Encounter (Signed)
Called to schedule AWV with NHA. Stated she was so busy with the holiday's. Maybe we can discuss it at a later time.

## 2022-05-01 DIAGNOSIS — Z85828 Personal history of other malignant neoplasm of skin: Secondary | ICD-10-CM | POA: Diagnosis not present

## 2022-05-01 DIAGNOSIS — Z08 Encounter for follow-up examination after completed treatment for malignant neoplasm: Secondary | ICD-10-CM | POA: Diagnosis not present

## 2022-05-02 DIAGNOSIS — H353131 Nonexudative age-related macular degeneration, bilateral, early dry stage: Secondary | ICD-10-CM | POA: Diagnosis not present

## 2022-05-02 DIAGNOSIS — H35372 Puckering of macula, left eye: Secondary | ICD-10-CM | POA: Diagnosis not present

## 2022-05-02 DIAGNOSIS — Z961 Presence of intraocular lens: Secondary | ICD-10-CM | POA: Diagnosis not present

## 2022-05-02 DIAGNOSIS — H43811 Vitreous degeneration, right eye: Secondary | ICD-10-CM | POA: Diagnosis not present

## 2022-05-11 ENCOUNTER — Ambulatory Visit (INDEPENDENT_AMBULATORY_CARE_PROVIDER_SITE_OTHER): Payer: Medicare Other | Admitting: Orthopaedic Surgery

## 2022-05-11 ENCOUNTER — Encounter: Payer: Self-pay | Admitting: Orthopaedic Surgery

## 2022-05-11 ENCOUNTER — Telehealth: Payer: Self-pay | Admitting: *Deleted

## 2022-05-11 DIAGNOSIS — Z96641 Presence of right artificial hip joint: Secondary | ICD-10-CM

## 2022-05-11 NOTE — Progress Notes (Signed)
The patient is an 81 year old female who is now 3 months status post a right total hip arthroplasty.  She reports that she is making good progress now with hip flexion and strengthening of her right hip.  She still little sore though she states.  She is very active and does not walk with assistive device and actually appears much younger than her stated age.  She demonstrated excellent right hip flexion today.  Her rotation is normal.  There is really no stiffness at all.  The neck somebody to see her is not for 6 months unless she is having issues.  Will just have a standing low AP pelvis at that visit.

## 2022-05-11 NOTE — Telephone Encounter (Signed)
Ortho bundle 90 day call completed. No further CM needs.

## 2022-06-27 ENCOUNTER — Ambulatory Visit (INDEPENDENT_AMBULATORY_CARE_PROVIDER_SITE_OTHER): Payer: Medicare Other | Admitting: Internal Medicine

## 2022-06-27 ENCOUNTER — Encounter: Payer: Self-pay | Admitting: Internal Medicine

## 2022-06-27 VITALS — BP 110/62 | HR 69 | Temp 97.8°F | Ht 63.0 in | Wt 125.0 lb

## 2022-06-27 DIAGNOSIS — R748 Abnormal levels of other serum enzymes: Secondary | ICD-10-CM

## 2022-06-27 DIAGNOSIS — F5101 Primary insomnia: Secondary | ICD-10-CM

## 2022-06-27 DIAGNOSIS — E559 Vitamin D deficiency, unspecified: Secondary | ICD-10-CM | POA: Diagnosis not present

## 2022-06-27 DIAGNOSIS — G4700 Insomnia, unspecified: Secondary | ICD-10-CM | POA: Insufficient documentation

## 2022-06-27 DIAGNOSIS — M1611 Unilateral primary osteoarthritis, right hip: Secondary | ICD-10-CM

## 2022-06-27 DIAGNOSIS — E538 Deficiency of other specified B group vitamins: Secondary | ICD-10-CM

## 2022-06-27 LAB — COMPREHENSIVE METABOLIC PANEL
ALT: 50 U/L — ABNORMAL HIGH (ref 0–35)
AST: 41 U/L — ABNORMAL HIGH (ref 0–37)
Albumin: 4 g/dL (ref 3.5–5.2)
Alkaline Phosphatase: 77 U/L (ref 39–117)
BUN: 19 mg/dL (ref 6–23)
CO2: 27 mEq/L (ref 19–32)
Calcium: 9.6 mg/dL (ref 8.4–10.5)
Chloride: 102 mEq/L (ref 96–112)
Creatinine, Ser: 1.03 mg/dL (ref 0.40–1.20)
GFR: 50.24 mL/min — ABNORMAL LOW (ref >60.00)
Glucose, Bld: 81 mg/dL (ref 70–99)
Potassium: 4.1 mEq/L (ref 3.5–5.1)
Sodium: 137 mEq/L (ref 135–145)
Total Bilirubin: 0.4 mg/dL (ref 0.2–1.2)
Total Protein: 7.8 g/dL (ref 6.0–8.3)

## 2022-06-27 LAB — CBC WITH DIFFERENTIAL/PLATELET
Basophils Absolute: 0 10*3/uL (ref 0.0–0.1)
Basophils Relative: 0.7 % (ref 0.0–3.0)
Eosinophils Absolute: 0.2 10*3/uL (ref 0.0–0.7)
Eosinophils Relative: 3 % (ref 0.0–5.0)
HCT: 40.4 % (ref 36.0–46.0)
Hemoglobin: 13.6 g/dL (ref 12.0–15.0)
Lymphocytes Relative: 27.2 % (ref 12.0–46.0)
Lymphs Abs: 1.5 10*3/uL (ref 0.7–4.0)
MCHC: 33.7 g/dL (ref 30.0–36.0)
MCV: 85.8 fl (ref 78.0–100.0)
Monocytes Absolute: 0.7 10*3/uL (ref 0.1–1.0)
Monocytes Relative: 11.8 % (ref 3.0–12.0)
Neutro Abs: 3.2 10*3/uL (ref 1.4–7.7)
Neutrophils Relative %: 57.3 % (ref 43.0–77.0)
Platelets: 216 10*3/uL (ref 150.0–400.0)
RBC: 4.71 Mil/uL (ref 3.87–5.11)
RDW: 14.8 % (ref 11.5–15.5)
WBC: 5.6 10*3/uL (ref 4.0–10.5)

## 2022-06-27 LAB — VITAMIN B12: Vitamin B-12: 400 pg/mL (ref 211–911)

## 2022-06-27 LAB — VITAMIN D 25 HYDROXY (VIT D DEFICIENCY, FRACTURES): VITD: 58.73 ng/mL (ref 30.00–100.00)

## 2022-06-27 MED ORDER — ZOLPIDEM TARTRATE 5 MG PO TABS: 5.0000 mg | ORAL_TABLET | Freq: Every evening | ORAL | 1 refills | Status: AC | PRN

## 2022-06-27 NOTE — Progress Notes (Signed)
Subjective:  Patient ID: Amanda Cole, female    DOB: [birth year]  Age: 82 y.o. MRN: [id number]  CC: No chief complaint on file.   HPI Amanda Cole presents for B12 def - not taking B12 S/p R hip THR - better C/o insomnia  Outpatient Medications Prior to Visit  Medication Sig Dispense Refill   Cholecalciferol (VITAMIN D3) 50 MCG (2000 UT) capsule Take 1 capsule (2,000 Units total) by mouth daily. 100 capsule 3   conjugated estrogens (PREMARIN) vaginal cream Use PV as directed (Patient taking differently: Place 1 applicator vaginally daily as needed (Dryness). Use PV as directed) 42.5 g 12   Multiple Vitamins-Minerals (ONE-A-DAY 50 PLUS PO) Take 1 tablet by mouth daily.     HYDROcodone-acetaminophen (NORCO/VICODIN) 5-325 MG tablet Take 1-2 tablets by mouth every 6 (six) hours as needed for severe pain. 30 tablet 0   methocarbamol (ROBAXIN) 500 MG tablet Take 1 tablet (500 mg total) by mouth every 6 (six) hours as needed for muscle spasms. 30 tablet 1   ondansetron (ZOFRAN-ODT) 4 MG disintegrating tablet Take 1 tablet (4 mg total) by mouth every 8 (eight) hours as needed for nausea or vomiting. 20 tablet 0   No facility-administered medications prior to visit.    ROS: Review of Systems  Constitutional:  Negative for activity change, appetite change, chills, fatigue and unexpected weight change.  HENT:  Negative for congestion, mouth sores and sinus pressure.   Eyes:  Negative for visual disturbance.  Respiratory:  Negative for cough and chest tightness.   Gastrointestinal:  Negative for abdominal pain and nausea.  Genitourinary:  Negative for difficulty urinating, frequency, genital sores and vaginal pain.  Musculoskeletal:  Negative for back pain and gait problem.  Skin:  Negative for pallor and rash.  Neurological:  Negative for dizziness, tremors, weakness, numbness and headaches.  Psychiatric/Behavioral:  Positive for sleep disturbance. Negative for confusion.      Objective:  BP 110/62 (BP Location: Left Arm, Patient Position: Sitting, Cuff Size: Normal)   Pulse 69   Temp 97.8 F (36.6 C) (Oral)   Ht 5' 3"$  (1.6 m)   Wt 125 lb (56.7 kg)   SpO2 96%   BMI 22.14 kg/m   BP Readings from Last 3 Encounters:  06/27/22 110/62  02/11/22 111/63  01/31/22 132/60    Wt Readings from Last 3 Encounters:  06/27/22 125 lb (56.7 kg)  02/10/22 125 lb (56.7 kg)  01/31/22 125 lb (56.7 kg)    Physical Exam Constitutional:      General: She is not in acute distress.    Appearance: Normal appearance. She is well-developed.  HENT:     Head: Normocephalic.     Right Ear: External ear normal.     Left Ear: External ear normal.     Nose: Nose normal.  Eyes:     General:        Right eye: No discharge.        Left eye: No discharge.     Conjunctiva/sclera: Conjunctivae normal.     Pupils: Pupils are equal, round, and reactive to light.  Neck:     Thyroid: No thyromegaly.     Vascular: No JVD.     Trachea: No tracheal deviation.  Cardiovascular:     Rate and Rhythm: Normal rate and regular rhythm.     Heart sounds: Normal heart sounds.  Pulmonary:     Effort: No respiratory distress.     Breath sounds: No stridor. No  wheezing.  Abdominal:     General: Bowel sounds are normal. There is no distension.     Palpations: Abdomen is soft. There is no mass.     Tenderness: There is no abdominal tenderness. There is no guarding or rebound.  Musculoskeletal:        General: No tenderness.     Cervical back: Normal range of motion and neck supple. No rigidity.  Lymphadenopathy:     Cervical: No cervical adenopathy.  Skin:    Findings: No erythema or rash.  Neurological:     Cranial Nerves: No cranial nerve deficit.     Motor: No abnormal muscle tone.     Coordination: Coordination normal.     Deep Tendon Reflexes: Reflexes normal.  Psychiatric:        Behavior: Behavior normal.        Thought Content: Thought content normal.        Judgment:  Judgment normal.   Stiff LS spine  Lab Results  Component Value Date   WBC 5.9 02/11/2022   HGB 10.3 (L) 02/11/2022   HCT 31.3 (L) 02/11/2022   PLT 176 02/11/2022   GLUCOSE 111 (H) 02/11/2022   CHOL 256 (H) 12/30/2020   TRIG 140.0 12/30/2020   HDL 58.00 12/30/2020   LDLDIRECT 161.5 03/29/2012   LDLCALC 170 (H) 12/30/2020   ALT 46 (H) 12/26/2021   AST 40 (H) 12/26/2021   NA 138 02/11/2022   K 3.9 02/11/2022   CL 108 02/11/2022   CREATININE 0.96 02/11/2022   BUN 16 02/11/2022   CO2 25 02/11/2022   TSH 2.81 12/30/2020   MICROALBUR <0.7 06/29/2020    DG Pelvis Portable  Result Date: 02/10/2022 CLINICAL DATA:  Status post right hip replacement EXAM: PORTABLE PELVIS 1-2 VIEWS COMPARISON:  06/01/2021, without report FINDINGS: Single AP view of the pelvis demonstrates interval right hip arthroplasty. No hardware complication. Expected subcutaneous edema about the operative site. The left femoral head is located with mild to moderate joint space narrowing. Sacroiliac joints are symmetric. Probable phleboliths in the pelvis. IMPRESSION: Expected appearance after right hip arthroplasty. Electronically Signed   By: Abigail Miyamoto M.D.   On: 02/10/2022 15:53   DG HIP PORT UNILAT WITH PELVIS 1V RIGHT  Result Date: 02/10/2022 CLINICAL DATA:  Right total hip arthroplasty EXAM: DG HIP (WITH OR WITHOUT PELVIS) 1V PORT RIGHT; DG C-ARM 1-60 MIN-NO REPORT COMPARISON:  08/06/2018 hip radiograph FLUOROSCOPY TIME:  Radiation Exposure Index (if provided by the fluoroscopic device): 0.9 mGy FINDINGS: Multiple spot fluoroscopic nondiagnostic intraoperative right hip radiographs demonstrate expected postsurgical changes from right total hip arthroplasty with no evidence of hip malalignment. IMPRESSION: Intraoperative fluoroscopic guidance for right total hip arthroplasty. Electronically Signed   By: Ilona Sorrel M.D.   On: 02/10/2022 14:02   DG C-Arm 1-60 Min-No Report  Result Date: 02/10/2022 Fluoroscopy  was utilized by the requesting physician.  No radiographic interpretation.    Assessment & Plan:   Problem List Items Addressed This Visit       Musculoskeletal and Integument   Unilateral primary osteoarthritis, right hip - Primary    2023 s/p THR      Relevant Orders   Comprehensive metabolic panel   CBC with Differential/Platelet     Other   Vitamin B12 deficiency   Relevant Orders   CBC with Differential/Platelet   Vitamin B12   Insomnia    Zolpidem low dose rare use   Potential benefits of a long term benzodiazepines  use  as well as potential risks  and complications were explained to the patient and were aknowledged.       Elevated liver enzymes   Relevant Orders   Comprehensive metabolic panel   CBC with Differential/Platelet   Other Visit Diagnoses     Vitamin D deficiency       Relevant Orders   VITAMIN D 25 Hydroxy (Vit-D Deficiency, Fractures)         Meds ordered this encounter  Medications   zolpidem (AMBIEN) 5 MG tablet    Sig: Take 1 tablet (5 mg total) by mouth at bedtime as needed for sleep.    Dispense:  30 tablet    Refill:  1      Follow-up: Return in about 6 months (around 12/26/2022) for Wellness Exam.  Walker Kehr, MD

## 2022-06-27 NOTE — Assessment & Plan Note (Signed)
Zolpidem low dose rare use   Potential benefits of a long term benzodiazepines  use as well as potential risks  and complications were explained to the patient and were aknowledged.

## 2022-06-27 NOTE — Assessment & Plan Note (Signed)
2023 s/p THR

## 2022-07-20 ENCOUNTER — Encounter: Payer: Self-pay | Admitting: Radiology

## 2022-11-09 ENCOUNTER — Ambulatory Visit: Payer: Medicare Other | Admitting: Physician Assistant

## 2022-11-09 ENCOUNTER — Ambulatory Visit: Payer: Medicare Other | Admitting: Orthopaedic Surgery

## 2022-11-09 ENCOUNTER — Other Ambulatory Visit: Payer: Self-pay

## 2022-11-09 ENCOUNTER — Encounter: Payer: Self-pay | Admitting: Physician Assistant

## 2022-11-09 DIAGNOSIS — Z96641 Presence of right artificial hip joint: Secondary | ICD-10-CM

## 2022-11-09 NOTE — Progress Notes (Signed)
HPI: [name] returns today for follow-up status post right total hip arthroplasty 02/10/2022.  She states she is overall doing well.  She states she does have some pain both hips anterior aspect whenever she first rises from seated position.  States it walks out quickly.  She denies any radicular symptoms down either leg.  She ranks his pain to be short-lived and to be 5 out of 10.  She has tried some stretching exercises as shown by therapy in the past and feels that these are somewhat beneficial.  She is unable to take NSAIDs or Tylenol.  Review of systems see HPI otherwise negative  Physical exam:  General: Well-developed well-nourished female no acute distress. Bilateral hips excellent range of motion without pain.  Slight tenderness over the trochanteric regions bilaterally.  Hip flexors 5 out of 5 strength.  Calf is supple nontender bilaterally.  Dorsiflexion plantarflexion bilateral ankles intact.  Ambulates without any assistive device.  Radiographs: AP pelvis lateral view right: No acute fractures or findings.  Bilateral hips well located.  Slight narrowing left hip joint.  Right total hip arthroplasty components well-seated.  Impression: Status post right total hip arthroplasty 02/10/2022 Bilateral anterior hip pain differential iliopsoas tendinitis  Plan: In regards to her right hip she can follow-up as needed.  Regards to the anterior hip pain she would like to continue the exercises as shown by therapy and see if this does not subside.  Questions were encouraged and answered at length.  She may benefit from biliary cystoscopy as an injection under imaging for both diagnostic and therapeutic purposes.

## 2022-12-04 DIAGNOSIS — L570 Actinic keratosis: Secondary | ICD-10-CM | POA: Diagnosis not present

## 2022-12-04 DIAGNOSIS — X32XXXD Exposure to sunlight, subsequent encounter: Secondary | ICD-10-CM | POA: Diagnosis not present

## 2022-12-04 DIAGNOSIS — Z08 Encounter for follow-up examination after completed treatment for malignant neoplasm: Secondary | ICD-10-CM | POA: Diagnosis not present

## 2022-12-04 DIAGNOSIS — L821 Other seborrheic keratosis: Secondary | ICD-10-CM | POA: Diagnosis not present

## 2022-12-04 DIAGNOSIS — L820 Inflamed seborrheic keratosis: Secondary | ICD-10-CM | POA: Diagnosis not present

## 2022-12-04 DIAGNOSIS — Z85828 Personal history of other malignant neoplasm of skin: Secondary | ICD-10-CM | POA: Diagnosis not present

## 2022-12-07 ENCOUNTER — Telehealth: Payer: Self-pay | Admitting: Internal Medicine

## 2022-12-07 NOTE — Telephone Encounter (Signed)
Contacted Amanda Cole to schedule their annual wellness visit. Patient declined to schedule AWV at this time. Sates it is a total waste of time. Patient put on the do not call list.  Amanda Cole Care Guide [facility] AWV TEAM Direct Dial: [phone]

## 2022-12-08 NOTE — Telephone Encounter (Signed)
Noted! Thank you

## 2022-12-13 ENCOUNTER — Encounter (INDEPENDENT_AMBULATORY_CARE_PROVIDER_SITE_OTHER): Payer: Self-pay

## 2022-12-26 ENCOUNTER — Encounter: Payer: Self-pay | Admitting: Internal Medicine

## 2022-12-26 ENCOUNTER — Ambulatory Visit (INDEPENDENT_AMBULATORY_CARE_PROVIDER_SITE_OTHER): Payer: Medicare Other | Admitting: Internal Medicine

## 2022-12-26 ENCOUNTER — Telehealth: Payer: Self-pay | Admitting: Internal Medicine

## 2022-12-26 ENCOUNTER — Other Ambulatory Visit: Payer: Self-pay

## 2022-12-26 VITALS — BP 110/70 | HR 60 | Temp 98.0°F | Ht 63.0 in | Wt 127.0 lb

## 2022-12-26 DIAGNOSIS — E785 Hyperlipidemia, unspecified: Secondary | ICD-10-CM

## 2022-12-26 DIAGNOSIS — R1030 Lower abdominal pain, unspecified: Secondary | ICD-10-CM

## 2022-12-26 DIAGNOSIS — E538 Deficiency of other specified B group vitamins: Secondary | ICD-10-CM

## 2022-12-26 DIAGNOSIS — G8929 Other chronic pain: Secondary | ICD-10-CM

## 2022-12-26 DIAGNOSIS — R748 Abnormal levels of other serum enzymes: Secondary | ICD-10-CM

## 2022-12-26 DIAGNOSIS — Z0000 Encounter for general adult medical examination without abnormal findings: Secondary | ICD-10-CM | POA: Diagnosis not present

## 2022-12-26 DIAGNOSIS — M5441 Lumbago with sciatica, right side: Secondary | ICD-10-CM | POA: Diagnosis not present

## 2022-12-26 LAB — CBC WITH DIFFERENTIAL/PLATELET
Basophils Absolute: 0 K/uL (ref 0.0–0.1)
Basophils Relative: 0.9 % (ref 0.0–3.0)
Eosinophils Absolute: 0.2 K/uL (ref 0.0–0.7)
Eosinophils Relative: 3.3 % (ref 0.0–5.0)
HCT: 40.9 % (ref 36.0–46.0)
Hemoglobin: 13.3 g/dL (ref 12.0–15.0)
Lymphocytes Relative: 30.8 % (ref 12.0–46.0)
Lymphs Abs: 1.5 K/uL (ref 0.7–4.0)
MCHC: 32.6 g/dL (ref 30.0–36.0)
MCV: 88 fl (ref 78.0–100.0)
Monocytes Absolute: 0.6 K/uL (ref 0.1–1.0)
Monocytes Relative: 13 % — ABNORMAL HIGH (ref 3.0–12.0)
Neutro Abs: 2.5 K/uL (ref 1.4–7.7)
Neutrophils Relative %: 52 % (ref 43.0–77.0)
Platelets: 176 K/uL (ref 150.0–400.0)
RBC: 4.65 Mil/uL (ref 3.87–5.11)
RDW: 14.8 % (ref 11.5–15.5)
WBC: 4.9 K/uL (ref 4.0–10.5)

## 2022-12-26 LAB — LIPID PANEL
Cholesterol: 295 mg/dL — ABNORMAL HIGH (ref 0–200)
HDL: 54.6 mg/dL (ref >39.00)
LDL Cholesterol: 213 mg/dL — ABNORMAL HIGH (ref 0–99)
NonHDL: 240.46
Total CHOL/HDL Ratio: 5
Triglycerides: 139 mg/dL (ref 0.0–149.0)
VLDL: 27.8 mg/dL (ref 0.0–40.0)

## 2022-12-26 LAB — COMPREHENSIVE METABOLIC PANEL
ALT: 37 U/L — ABNORMAL HIGH (ref 0–35)
AST: 40 U/L — ABNORMAL HIGH (ref 0–37)
Albumin: 4.1 g/dL (ref 3.5–5.2)
Alkaline Phosphatase: 76 U/L (ref 39–117)
BUN: 18 mg/dL (ref 6–23)
CO2: 24 mEq/L (ref 19–32)
Calcium: 9.3 mg/dL (ref 8.4–10.5)
Chloride: 103 mEq/L (ref 96–112)
Creatinine, Ser: 1.07 mg/dL (ref 0.40–1.20)
GFR: 47.83 mL/min — ABNORMAL LOW (ref >60.00)
Glucose, Bld: 85 mg/dL (ref 70–99)
Potassium: 4 mEq/L (ref 3.5–5.1)
Sodium: 135 mEq/L (ref 135–145)
Total Bilirubin: 0.5 mg/dL (ref 0.2–1.2)
Total Protein: 7.8 g/dL (ref 6.0–8.3)

## 2022-12-26 LAB — TSH: TSH: 1.89 u[IU]/mL (ref 0.35–5.50)

## 2022-12-26 MED ORDER — PREMARIN 0.625 MG/GM VA CREA: TOPICAL_CREAM | VAGINAL | 3 refills | Status: DC

## 2022-12-26 NOTE — Assessment & Plan Note (Signed)

## 2022-12-26 NOTE — Assessment & Plan Note (Signed)
On MVI 

## 2022-12-26 NOTE — Patient Instructions (Addendum)
Blue-Emu cream was -- use 2-3 times a day  PT or chiropractic referral   Chair yoga

## 2022-12-26 NOTE — Assessment & Plan Note (Addendum)
B pain post - hip surgery, [facility] LBP PT or chiropractic ref was offered Chair yoga Blue-Emu cream was recommended to use 2-3 times a day Handicapped

## 2022-12-26 NOTE — Telephone Encounter (Signed)
Pharmacy need full direction on the medication please call  [facility] ask for [facility] at [phone] with update.conjugated estrogens (PREMARIN) vaginal cream

## 2022-12-26 NOTE — Progress Notes (Signed)
Subjective:  Patient ID: Amanda Cole, female    DOB: 1941/03/12  Age: 82 y.o. MRN: [id number]  CC: Annual Exam   HPI LAJLA HILBUN presents for a well exam The pt is s/p R THR 02/10/22 C/o groin pain - ?tendonitis C/o LBP  Outpatient Medications Prior to Visit  Medication Sig Dispense Refill  . Cholecalciferol (VITAMIN D3) 50 MCG (2000 UT) capsule Take 1 capsule (2,000 Units total) by mouth daily. 100 capsule 3  . Multiple Vitamins-Minerals (ONE-A-DAY 50 PLUS PO) Take 1 tablet by mouth daily.    Marland Kitchen conjugated estrogens (PREMARIN) vaginal cream Use PV as directed (Patient taking differently: Place 1 applicator vaginally daily as needed (Dryness). Use PV as directed) 42.5 g 12  . zolpidem (AMBIEN) 5 MG tablet Take 1 tablet (5 mg total) by mouth at bedtime as needed for sleep. (Patient not taking: Reported on 12/26/2022) 30 tablet 1   No facility-administered medications prior to visit.    ROS: Review of Systems  Constitutional:  Negative for activity change, appetite change, chills, fatigue and unexpected weight change.  HENT:  Negative for congestion, mouth sores and sinus pressure.   Eyes:  Negative for visual disturbance.  Respiratory:  Negative for cough and chest tightness.   Gastrointestinal:  Negative for abdominal pain and nausea.  Genitourinary:  Negative for difficulty urinating, frequency and vaginal pain.  Musculoskeletal:  Positive for back pain and gait problem.  Skin:  Negative for pallor and rash.  Neurological:  Negative for dizziness, tremors, weakness, numbness and headaches.  Psychiatric/Behavioral:  Negative for confusion and sleep disturbance.     Objective:  BP 110/70 (BP Location: Left Arm, Patient Position: Sitting, Cuff Size: Large)   Pulse 60   Temp 98 F (36.7 C) (Oral)   Ht 5\' 3"  (1.6 m)   Wt 127 lb (57.6 kg)   SpO2 96%   BMI 22.50 kg/m   BP Readings from Last 3 Encounters:  12/26/22 110/70  06/27/22 110/62  02/11/22 111/63    Wt  Readings from Last 3 Encounters:  12/26/22 127 lb (57.6 kg)  06/27/22 125 lb (56.7 kg)  02/10/22 125 lb (56.7 kg)    Physical Exam Constitutional:      General: She is not in acute distress.    Appearance: She is well-developed.  HENT:     Head: Normocephalic.     Right Ear: External ear normal.     Left Ear: External ear normal.     Nose: Nose normal.  Eyes:     General:        Right eye: No discharge.        Left eye: No discharge.     Conjunctiva/sclera: Conjunctivae normal.     Pupils: Pupils are equal, round, and reactive to light.  Neck:     Thyroid: No thyromegaly.     Vascular: No JVD.     Trachea: No tracheal deviation.  Cardiovascular:     Rate and Rhythm: Normal rate and regular rhythm.     Heart sounds: Normal heart sounds.  Pulmonary:     Effort: No respiratory distress.     Breath sounds: No stridor. No wheezing.  Abdominal:     General: Bowel sounds are normal. There is no distension.     Palpations: Abdomen is soft. There is no mass.     Tenderness: There is no abdominal tenderness. There is no guarding or rebound.  Musculoskeletal:        General: No  tenderness.     Cervical back: Normal range of motion and neck supple. No rigidity.  Lymphadenopathy:     Cervical: No cervical adenopathy.  Skin:    Findings: No erythema or rash.  Neurological:     Cranial Nerves: No cranial nerve deficit.     Motor: No abnormal muscle tone.     Coordination: Coordination normal.     Deep Tendon Reflexes: Reflexes normal.  Psychiatric:        Behavior: Behavior normal.        Thought Content: Thought content normal.        Judgment: Judgment normal.  LS - sensitive w/ROM  Lab Results  Component Value Date   WBC 5.6 06/27/2022   HGB 13.6 06/27/2022   HCT 40.4 06/27/2022   PLT 216.0 06/27/2022   GLUCOSE 81 06/27/2022   CHOL 256 (H) 12/30/2020   TRIG 140.0 12/30/2020   HDL 58.00 12/30/2020   LDLDIRECT 161.5 03/29/2012   LDLCALC 170 (H) 12/30/2020   ALT 50  (H) 06/27/2022   AST 41 (H) 06/27/2022   NA 137 06/27/2022   K 4.1 06/27/2022   CL 102 06/27/2022   CREATININE 1.03 06/27/2022   BUN 19 06/27/2022   CO2 27 06/27/2022   TSH 2.81 12/30/2020   MICROALBUR <0.7 06/29/2020    DG Pelvis Portable  Result Date: 02/10/2022 CLINICAL DATA:  Status post right hip replacement EXAM: PORTABLE PELVIS 1-2 VIEWS COMPARISON:  06/01/2021, without report FINDINGS: Single AP view of the pelvis demonstrates interval right hip arthroplasty. No hardware complication. Expected subcutaneous edema about the operative site. The left femoral head is located with mild to moderate joint space narrowing. Sacroiliac joints are symmetric. Probable phleboliths in the pelvis. IMPRESSION: Expected appearance after right hip arthroplasty. Electronically Signed   By: Jeronimo Greaves M.D.   On: 02/10/2022 15:53   DG HIP PORT UNILAT WITH PELVIS 1V RIGHT  Result Date: 02/10/2022 CLINICAL DATA:  Right total hip arthroplasty EXAM: DG HIP (WITH OR WITHOUT PELVIS) 1V PORT RIGHT; DG C-ARM 1-60 MIN-NO REPORT COMPARISON:  08/06/2018 hip radiograph FLUOROSCOPY TIME:  Radiation Exposure Index (if provided by the fluoroscopic device): 0.9 mGy FINDINGS: Multiple spot fluoroscopic nondiagnostic intraoperative right hip radiographs demonstrate expected postsurgical changes from right total hip arthroplasty with no evidence of hip malalignment. IMPRESSION: Intraoperative fluoroscopic guidance for right total hip arthroplasty. Electronically Signed   By: Delbert Phenix M.D.   On: 02/10/2022 14:02   DG C-Arm 1-60 Min-No Report  Result Date: 02/10/2022 Fluoroscopy was utilized by the requesting physician.  No radiographic interpretation.    Assessment & Plan:   Problem List Items Addressed This Visit     Vitamin B12 deficiency    On MVI      Relevant Orders   CBC with Differential/Platelet   Dyslipidemia   Relevant Orders   TSH   CBC with Differential/Platelet   Well adult exam - Primary      We discussed age appropriate health related issues, including available/recomended screening tests and vaccinations. Labs were ordered to be later reviewed . All questions were answered. We discussed one or more of the following - seat belt use, use of sunscreen/sun exposure exercise, fall risk reduction, second hand smoke exposure, firearm use and storage, seat belt use, a need for adhering to healthy diet and exercise. Labs were ordered.  All questions were answered.       Relevant Orders   Comprehensive metabolic panel   TSH   CBC with Differential/Platelet  Lipid panel   Elevated liver enzymes   Relevant Orders   Comprehensive metabolic panel   Lipid panel   Low back pain    B pain post - hip surgery, [facility] LBP PT or chiropractic ref was offered Chair yoga Blue-Emu cream was recommended to use 2-3 times a day Handicapped      Groin pain    B pain post - hip surgery, [facility] PT or chiropractic ref was offered Chair yoga Blue-Emu cream was recommended to use 2-3 times a day        Relevant Orders   Comprehensive metabolic panel   CBC with Differential/Platelet      Meds ordered this encounter  Medications  . conjugated estrogens (PREMARIN) vaginal cream    Sig: Use PV as directed    Dispense:  42.5 g    Refill:  3      Follow-up: Return in about 3 months (around 03/28/2023) for a follow-up visit.  Sonda Primes, MD

## 2022-12-26 NOTE — Assessment & Plan Note (Signed)
B pain post - hip surgery, [facility] PT or chiropractic ref was offered Chair yoga Blue-Emu cream was recommended to use 2-3 times a day

## 2022-12-27 MED ORDER — PREMARIN 0.625 MG/GM VA CREA: TOPICAL_CREAM | VAGINAL | 3 refills | Status: DC

## 2022-12-27 NOTE — Telephone Encounter (Signed)
MD sent new rx,,.lmb

## 2022-12-27 NOTE — Telephone Encounter (Signed)
Okay.  Done.  Thanks 

## 2023-01-29 DIAGNOSIS — X32XXXD Exposure to sunlight, subsequent encounter: Secondary | ICD-10-CM | POA: Diagnosis not present

## 2023-01-29 DIAGNOSIS — L570 Actinic keratosis: Secondary | ICD-10-CM | POA: Diagnosis not present

## 2023-02-07 IMAGING — MG DIGITAL SCREENING BILAT W/ CAD
4 series · 4 of 4 positions shown · non-contrast
Comparison: None.

CLINICAL DATA: Screening.

EXAM:
DIGITAL SCREENING BILATERAL MAMMOGRAM WITH CAD
TECHNIQUE: Bilateral screening digital craniocaudal and mediolateral oblique
mammograms were obtained. The images were evaluated with
computer-aided detection.

[R CC]
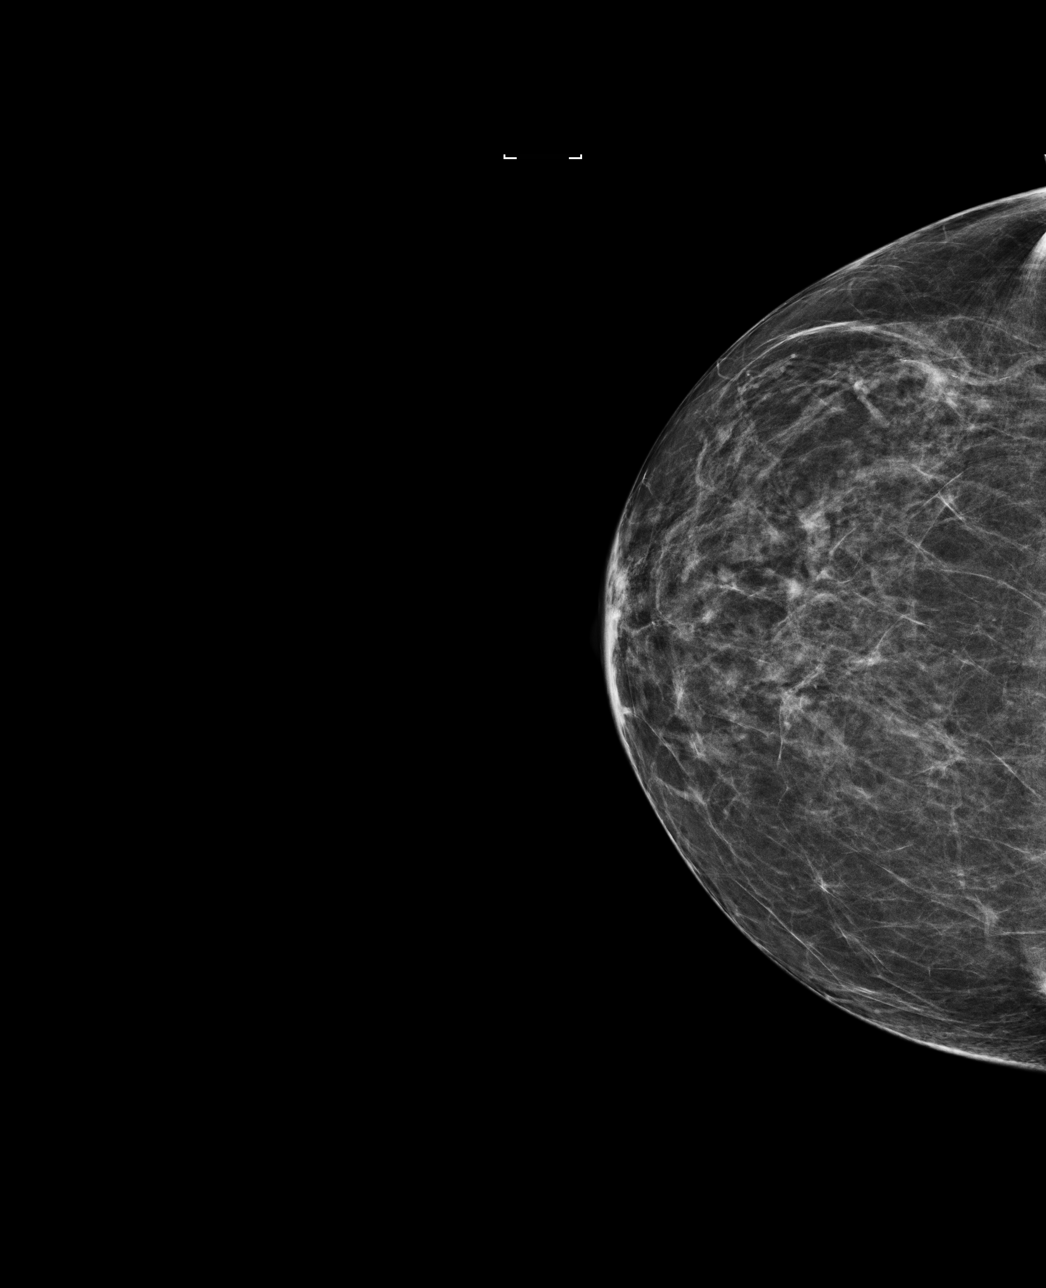

[R MLO]
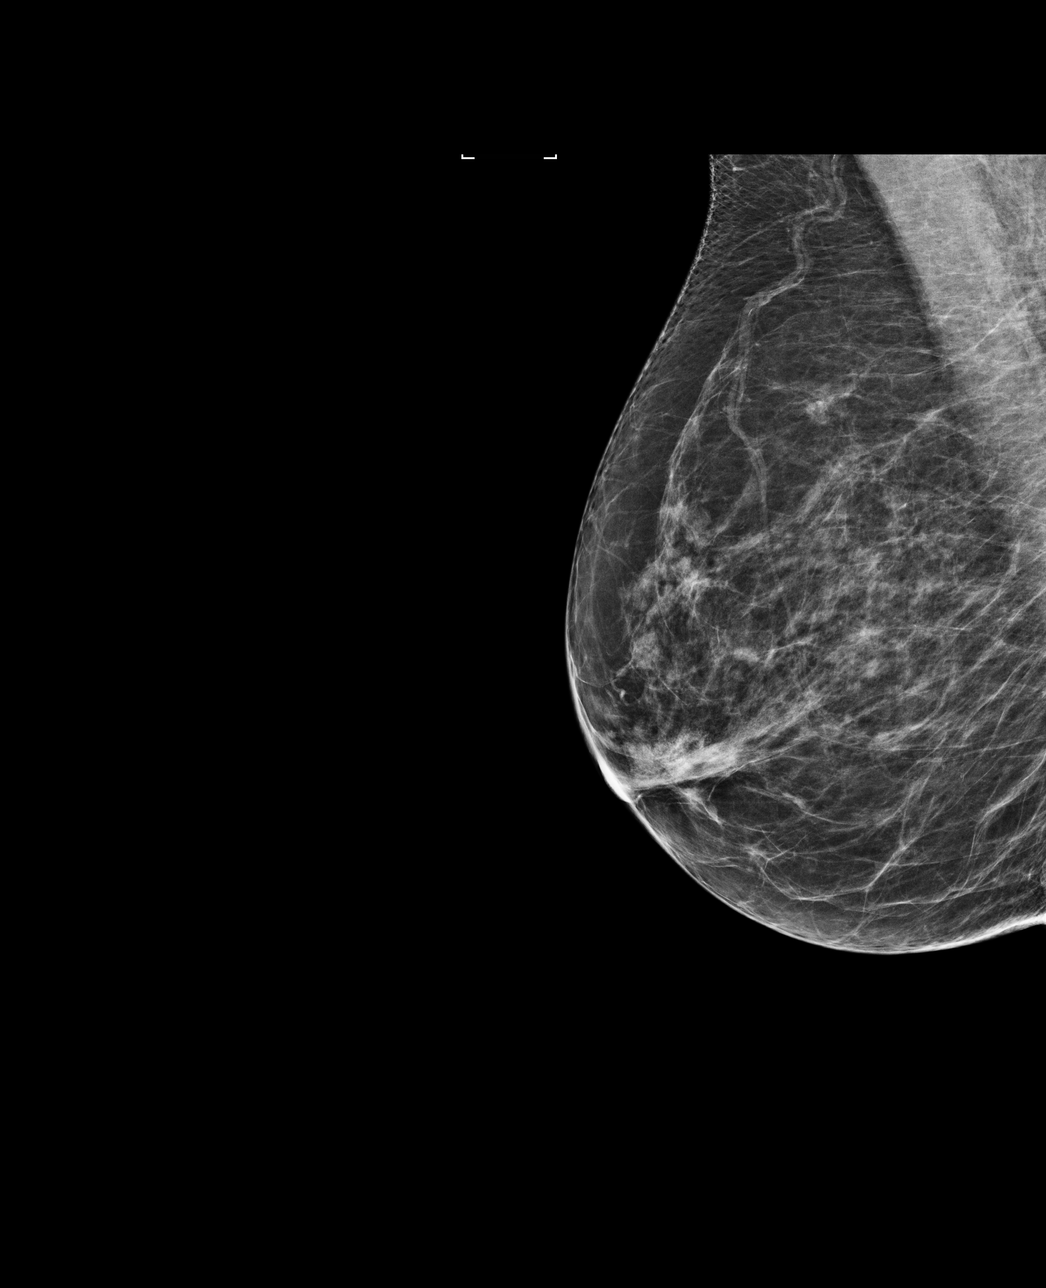

[L CC]
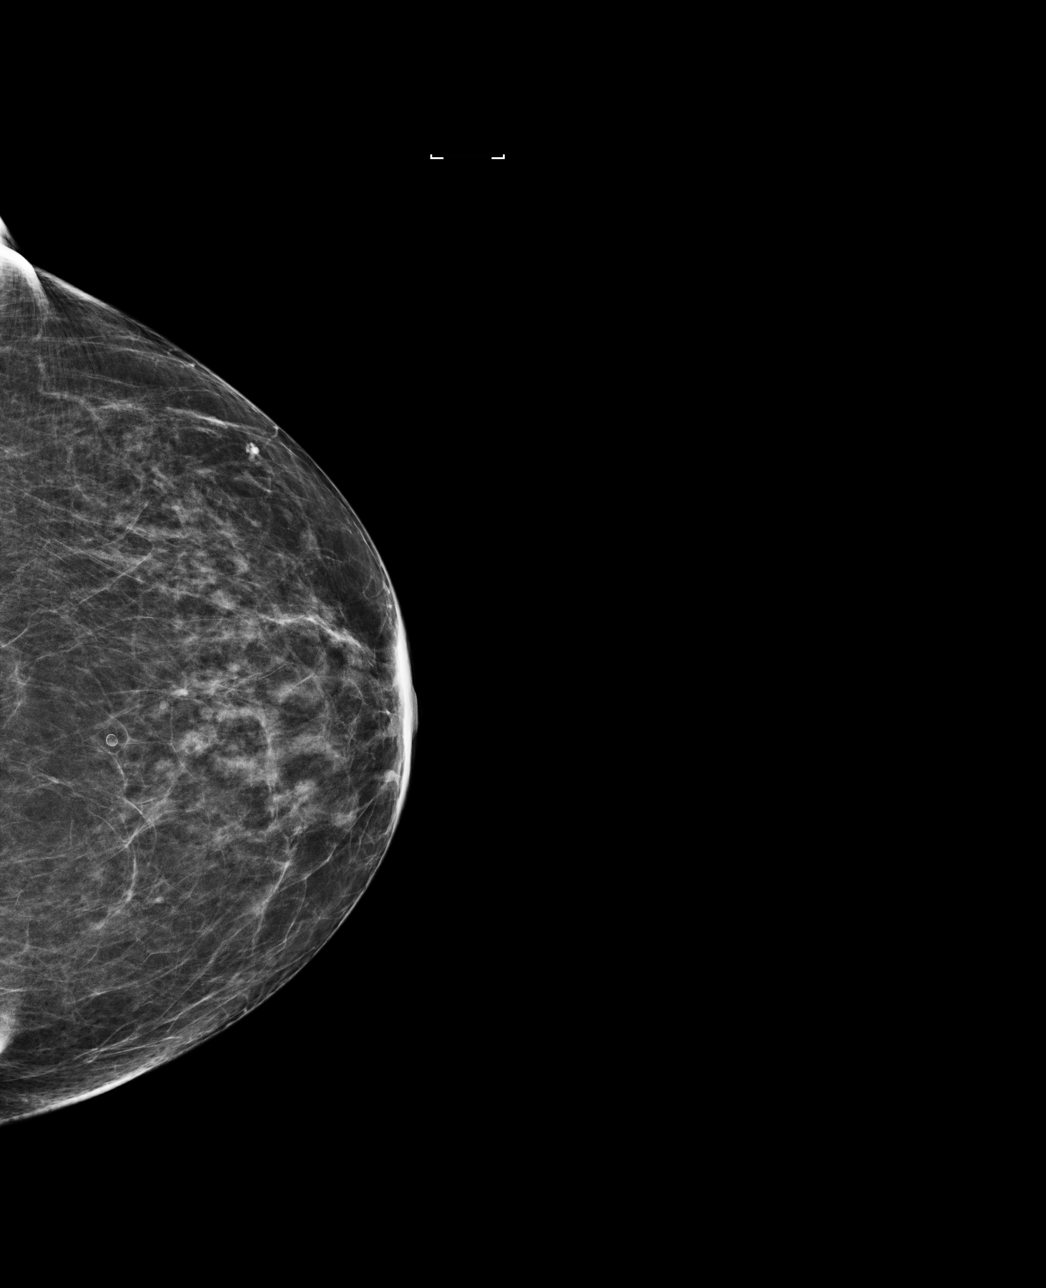

[L MLO]
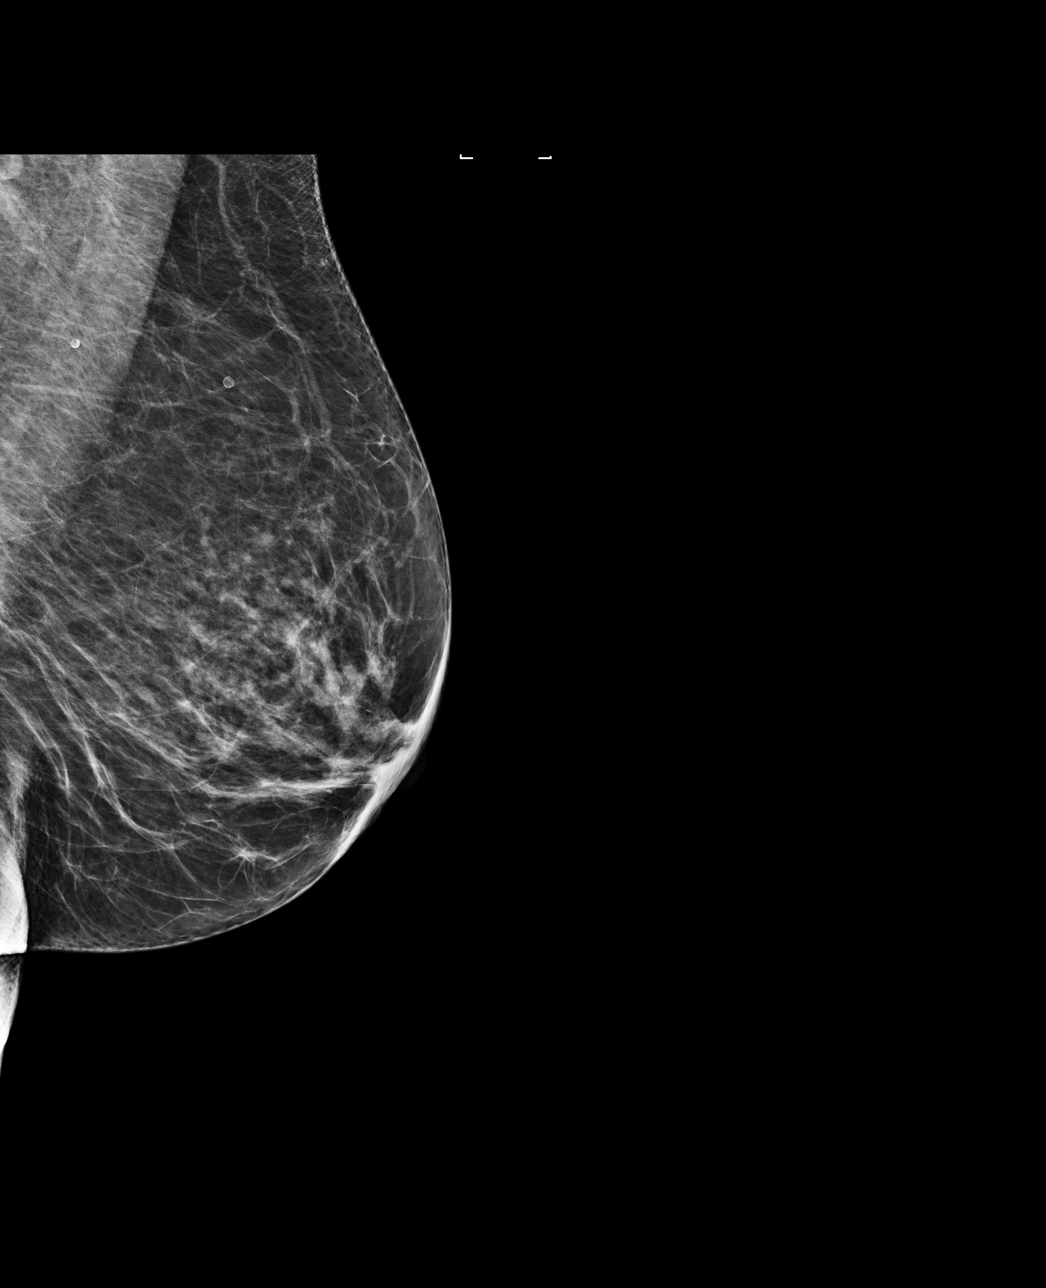

[4 of 4 positions shown; findings below may reference images not displayed]

ACR Breast Density Category b: There are scattered areas of
fibroglandular density.
FINDINGS: There are no findings suspicious for malignancy.
IMPRESSION: No mammographic evidence of malignancy. A result letter of this
screening mammogram will be mailed directly to the patient.

RECOMMENDATION:
Screening mammogram in one year. (Code:1U-[id number])

BI-RADS CATEGORY  1: Negative.

## 2023-03-28 ENCOUNTER — Ambulatory Visit: Payer: Medicare Other | Admitting: Internal Medicine

## 2023-05-11 DIAGNOSIS — H43811 Vitreous degeneration, right eye: Secondary | ICD-10-CM | POA: Diagnosis not present

## 2023-05-11 DIAGNOSIS — H35372 Puckering of macula, left eye: Secondary | ICD-10-CM | POA: Diagnosis not present

## 2023-05-11 DIAGNOSIS — H353131 Nonexudative age-related macular degeneration, bilateral, early dry stage: Secondary | ICD-10-CM | POA: Diagnosis not present

## 2023-05-11 DIAGNOSIS — Z961 Presence of intraocular lens: Secondary | ICD-10-CM | POA: Diagnosis not present

## 2023-06-01 ENCOUNTER — Other Ambulatory Visit: Payer: Self-pay

## 2023-06-01 ENCOUNTER — Encounter (HOSPITAL_BASED_OUTPATIENT_CLINIC_OR_DEPARTMENT_OTHER): Payer: Self-pay | Admitting: Emergency Medicine

## 2023-06-01 ENCOUNTER — Emergency Department (HOSPITAL_BASED_OUTPATIENT_CLINIC_OR_DEPARTMENT_OTHER)
Admission: EM | Admit: 2023-06-01 | Discharge: 2023-06-01 | Disposition: A | Discharge status: Home / Self Care | Payer: Medicare Other | Attending: Emergency Medicine | Admitting: Emergency Medicine

## 2023-06-01 ENCOUNTER — Ambulatory Visit
Admission: EM | Admit: 2023-06-01 | Discharge: 2023-06-01 | Disposition: A | Discharge status: Home / Self Care | Payer: Medicare Other | Attending: Family Medicine | Admitting: Family Medicine

## 2023-06-01 DIAGNOSIS — S81811A Laceration without foreign body, right lower leg, initial encounter: Secondary | ICD-10-CM | POA: Diagnosis not present

## 2023-06-01 DIAGNOSIS — Z23 Encounter for immunization: Secondary | ICD-10-CM

## 2023-06-01 DIAGNOSIS — Y92512 Supermarket, store or market as the place of occurrence of the external cause: Secondary | ICD-10-CM | POA: Diagnosis not present

## 2023-06-01 DIAGNOSIS — M79604 Pain in right leg: Secondary | ICD-10-CM

## 2023-06-01 DIAGNOSIS — W228XXA Striking against or struck by other objects, initial encounter: Secondary | ICD-10-CM | POA: Diagnosis not present

## 2023-06-01 DIAGNOSIS — S99911A Unspecified injury of right ankle, initial encounter: Secondary | ICD-10-CM | POA: Diagnosis present

## 2023-06-01 DIAGNOSIS — S91011A Laceration without foreign body, right ankle, initial encounter: Secondary | ICD-10-CM | POA: Insufficient documentation

## 2023-06-01 MED ORDER — LIDOCAINE-EPINEPHRINE-TETRACAINE (LET) TOPICAL GEL
3.0000 mL | Freq: Once | TOPICAL | Status: AC
  Administered 2023-06-01: 3 mL via TOPICAL
  Filled 2023-06-01: qty 3

## 2023-06-01 MED ORDER — LIDOCAINE HCL (PF) 1 % IJ SOLN
10.0000 mL | Freq: Once | INTRAMUSCULAR | Status: AC
  Administered 2023-06-01: 10 mL
  Filled 2023-06-01: qty 10

## 2023-06-01 MED ORDER — TETANUS-DIPHTH-ACELL PERTUSSIS 5-2.5-18.5 LF-MCG/0.5 IM SUSY
0.5000 mL | PREFILLED_SYRINGE | Freq: Once | INTRAMUSCULAR | Status: AC
  Administered 2023-06-01: 0.5 mL via INTRAMUSCULAR
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
## 2023-06-01 NOTE — Discharge Instructions (Addendum)
You are in need of a higher level of laceration repair than we can perform in the urgent care setting.  Please go to any emergency room of your choosing.  We have updated your Tdap as you requested.
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
## 2023-06-01 NOTE — ED Notes (Signed)
Patient is being discharged from the Urgent Care and sent to the Emergency Department via POV . Per Urban Gibson, PA-C, patient is in need of higher level of care due to laceration. Patient is aware and verbalizes understanding of plan of care.  Vitals:   06/01/23 1514  BP: (!) 154/85  Pulse: 80  Resp: 16  Temp: 97.8 F (36.6 C)  SpO2: 95%

## 2023-06-01 NOTE — Discharge Instructions (Signed)
You were evaluated in the emergency room for laceration.  This was sutured closed.  Please return to the emergency room or present to your primary care office in 7 to 10 days for suture removal.  If you experience any new or worsening symptoms including fevers, chills, redness, worsening pain and swelling please return to the emergency room.  You are additionally fitted with a boot and crutches to prevent you from moving your ankle and tearing the sutures.  You can wear the boot when you are walking, please avoid weightbearing as much as you can over the next week.

## 2023-06-05 ENCOUNTER — Ambulatory Visit: Payer: Medicare Other | Admitting: Internal Medicine

## 2023-06-05 ENCOUNTER — Encounter: Payer: Self-pay | Admitting: Internal Medicine

## 2023-06-05 VITALS — BP 130/80 | HR 61 | Temp 98.2°F | Ht 63.0 in | Wt 127.0 lb

## 2023-06-05 DIAGNOSIS — S9000XA Contusion of unspecified ankle, initial encounter: Secondary | ICD-10-CM | POA: Insufficient documentation

## 2023-06-05 DIAGNOSIS — S9001XD Contusion of right ankle, subsequent encounter: Secondary | ICD-10-CM | POA: Diagnosis not present

## 2023-06-05 DIAGNOSIS — E538 Deficiency of other specified B group vitamins: Secondary | ICD-10-CM

## 2023-06-05 DIAGNOSIS — S81811D Laceration without foreign body, right lower leg, subsequent encounter: Secondary | ICD-10-CM

## 2023-06-05 MED ORDER — HYDROCODONE-ACETAMINOPHEN 5-325 MG PO TABS: 1.0000 | ORAL_TABLET | Freq: Four times a day (QID) | ORAL | 0 refills | Status: DC | PRN

## 2023-06-05 MED ORDER — MUPIROCIN 2 % EX OINT: TOPICAL_OINTMENT | CUTANEOUS | 0 refills | Status: AC

## 2023-06-05 MED ORDER — AMOXICILLIN-POT CLAVULANATE 500-125 MG PO TABS: 1.0000 | ORAL_TABLET | Freq: Three times a day (TID) | ORAL | 0 refills | Status: DC

## 2023-06-05 NOTE — Assessment & Plan Note (Addendum)
Obtain ankle x ray ACE wrap
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
## 2023-06-12 ENCOUNTER — Ambulatory Visit (INDEPENDENT_AMBULATORY_CARE_PROVIDER_SITE_OTHER): Payer: Medicare Other

## 2023-06-12 DIAGNOSIS — M25571 Pain in right ankle and joints of right foot: Secondary | ICD-10-CM | POA: Diagnosis not present

## 2023-06-12 DIAGNOSIS — S81811D Laceration without foreign body, right lower leg, subsequent encounter: Secondary | ICD-10-CM | POA: Diagnosis not present

## 2023-06-12 DIAGNOSIS — S91011A Laceration without foreign body, right ankle, initial encounter: Secondary | ICD-10-CM | POA: Diagnosis not present

## 2023-06-17 ENCOUNTER — Encounter: Payer: Self-pay | Admitting: Internal Medicine

## 2023-06-19 ENCOUNTER — Encounter: Payer: Self-pay | Admitting: Internal Medicine

## 2023-06-19 ENCOUNTER — Ambulatory Visit: Payer: Medicare Other | Admitting: Internal Medicine

## 2023-06-19 VITALS — BP 120/78 | HR 69 | Temp 98.3°F | Ht 63.0 in

## 2023-06-19 DIAGNOSIS — S81811D Laceration without foreign body, right lower leg, subsequent encounter: Secondary | ICD-10-CM | POA: Diagnosis not present

## 2023-06-19 DIAGNOSIS — E538 Deficiency of other specified B group vitamins: Secondary | ICD-10-CM | POA: Diagnosis not present

## 2023-06-19 DIAGNOSIS — R300 Dysuria: Secondary | ICD-10-CM | POA: Diagnosis not present

## 2023-06-19 LAB — URINALYSIS
Bilirubin Urine: NEGATIVE
Hgb urine dipstick: NEGATIVE
Ketones, ur: NEGATIVE
Leukocytes,Ua: NEGATIVE
Nitrite: NEGATIVE
Specific Gravity, Urine: 1.01 (ref 1.000–1.030)
Total Protein, Urine: NEGATIVE
Urine Glucose: NEGATIVE
Urobilinogen, UA: 0.2 (ref 0.0–1.0)
pH: 6 (ref 5.0–8.0)

## 2023-06-19 MED ORDER — CEPHALEXIN 500 MG PO CAPS: 500.0000 mg | ORAL_CAPSULE | Freq: Three times a day (TID) | ORAL | 1 refills | Status: DC

## 2023-06-19 NOTE — Assessment & Plan Note (Signed)
Continue with a multivitamin

## 2023-06-19 NOTE — Assessment & Plan Note (Signed)
On a MVI °

## 2023-06-19 NOTE — Assessment & Plan Note (Addendum)
New Check UA Keflex 3 d

## 2023-06-19 NOTE — Assessment & Plan Note (Signed)
In a boot, using cane Wound is healing, not healed yet; clean RTC 1 wk for suture removal
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
## 2023-06-20 ENCOUNTER — Encounter: Payer: Self-pay | Admitting: Internal Medicine

## 2023-06-25 ENCOUNTER — Ambulatory Visit (INDEPENDENT_AMBULATORY_CARE_PROVIDER_SITE_OTHER): Payer: Medicare Other | Admitting: Internal Medicine

## 2023-06-25 ENCOUNTER — Encounter: Payer: Self-pay | Admitting: Internal Medicine

## 2023-06-25 VITALS — BP 138/82 | Temp 97.6°F | Ht 63.0 in | Wt 130.5 lb

## 2023-06-25 DIAGNOSIS — E538 Deficiency of other specified B group vitamins: Secondary | ICD-10-CM | POA: Diagnosis not present

## 2023-06-25 DIAGNOSIS — S81811D Laceration without foreign body, right lower leg, subsequent encounter: Secondary | ICD-10-CM | POA: Diagnosis not present

## 2023-06-25 DIAGNOSIS — S9001XD Contusion of right ankle, subsequent encounter: Secondary | ICD-10-CM

## 2023-06-25 DIAGNOSIS — R748 Abnormal levels of other serum enzymes: Secondary | ICD-10-CM

## 2023-06-25 NOTE — Assessment & Plan Note (Signed)
 Check LFTs periodically

## 2023-06-25 NOTE — Progress Notes (Signed)
 Subjective:  Patient ID: Amanda Cole, female    DOB: November 03, 1940  Age: 83 y.o. MRN: [id number]  CC: Suture / Staple Removal (On the right lower leg. Some redness to it, soreness )   HPI KERRIA FRIEDERICHS presents for wound check, suture removal, other chronic issues  Outpatient Medications Prior to Visit  Medication Sig Dispense Refill   cephALEXin  (KEFLEX ) 500 MG capsule Take 1 capsule (500 mg total) by mouth 3 (three) times daily. 9 capsule 1   Cholecalciferol  (VITAMIN D3) 50 MCG (2000 UT) capsule Take 1 capsule (2,000 Units total) by mouth daily. 100 capsule 3   conjugated estrogens  (PREMARIN ) vaginal cream Use PV as directed (1 applicator Vaginal Daily PRN, Dryness) 42.5 g 3   Multiple Vitamins-Minerals (ONE-A-DAY 50 PLUS PO) Take 1 tablet by mouth daily.     mupirocin  ointment (BACTROBAN ) 2 % On leg wound w/dressing change qd or bid 30 g 0   zolpidem  (AMBIEN ) 5 MG tablet Take 1 tablet (5 mg total) by mouth at bedtime as needed for sleep. 30 tablet 1   HYDROcodone -acetaminophen  (NORCO/VICODIN) 5-325 MG tablet Take 1 tablet by mouth every 6 (six) hours as needed for severe pain (pain score 7-10). (Patient not taking: Reported on 06/25/2023) 20 tablet 0   amoxicillin -clavulanate (AUGMENTIN ) 500-125 MG tablet Take 1 tablet by mouth 3 (three) times daily. (Patient not taking: Reported on 06/25/2023) 30 tablet 0   No facility-administered medications prior to visit.    ROS: Review of Systems  Constitutional:  Negative for chills and fever.  Musculoskeletal:  Positive for gait problem.  Skin:  Positive for wound.  Hematological:  Negative for adenopathy. Does not bruise/bleed easily.    Objective:  BP 138/82   Temp 97.6 F (36.4 C) (Temporal)   Ht 5\' 3"  (1.6 m)   Wt 130 lb 8 oz (59.2 kg)   SpO2 98%   BMI 23.12 kg/m   BP Readings from Last 3 Encounters:  06/25/23 138/82  06/19/23 120/78  06/05/23 130/80    Wt Readings from Last 3 Encounters:  06/25/23 130 lb 8 oz (59.2  kg)  06/05/23 127 lb (57.6 kg)  06/01/23 126 lb (57.2 kg)    Physical Exam Constitutional:      Appearance: Normal appearance.  Musculoskeletal:        General: Tenderness and signs of injury present. No deformity.     Right lower leg: No edema.     Left lower leg: No edema.  Skin:    Coloration: Skin is not pale.     Findings: Lesion present. No erythema or rash.  Neurological:     Mental Status: She is oriented to person, place, and time. Mental status is at baseline.     Motor: No weakness.     Coordination: Coordination normal.     Gait: Gait abnormal.  Psychiatric:        Mood and Affect: Mood normal.        Judgment: Judgment normal.      Wound is healed - R post ankle Achilles tendon R is painful to palpation  14 sutures were removed under sterile condition Wound was dressed ACE wrap applied  FTF>30 min   Lab Results  Component Value Date   WBC 4.9 12/26/2022   HGB 13.3 12/26/2022   HCT 40.9 12/26/2022   PLT 176.0 12/26/2022   GLUCOSE 85 12/26/2022   CHOL 295 (H) 12/26/2022   TRIG 139.0 12/26/2022   HDL 54.60 12/26/2022  LDLDIRECT 161.5 03/29/2012   LDLCALC 213 (H) 12/26/2022   ALT 37 (H) 12/26/2022   AST 40 (H) 12/26/2022   NA 135 12/26/2022   K 4.0 12/26/2022   CL 103 12/26/2022   CREATININE 1.07 12/26/2022   BUN 18 12/26/2022   CO2 24 12/26/2022   TSH 1.89 12/26/2022   MICROALBUR <0.7 06/29/2020    No results found.  Assessment & Plan:   Problem List Items Addressed This Visit     Vitamin B12 deficiency   Continue with a multivitamin      Elevated liver enzymes   Check LFTs periodically       Leg laceration, right, subsequent encounter - Primary   14 sutures were removed under sterile condition Wound was dressed ACE wrap applied      Ankle contusion   Ortho ref if problems ACE wrap RTC 4 wks         No orders of the defined types were placed in this encounter.     Follow-up: Return in about 4 weeks (around  07/23/2023) for a follow-up visit.  Anitra Barn, MD

## 2023-06-25 NOTE — Assessment & Plan Note (Signed)
 Ortho ref if problems ACE wrap RTC 4 wks

## 2023-06-25 NOTE — Assessment & Plan Note (Signed)
 Continue with a multivitamin

## 2023-06-25 NOTE — Assessment & Plan Note (Signed)
 14 sutures were removed under sterile condition Wound was dressed ACE wrap applied

## 2023-07-24 ENCOUNTER — Encounter: Payer: Self-pay | Admitting: Internal Medicine

## 2023-07-24 ENCOUNTER — Ambulatory Visit (INDEPENDENT_AMBULATORY_CARE_PROVIDER_SITE_OTHER): Payer: Medicare Other | Admitting: Internal Medicine

## 2023-07-24 VITALS — BP 130/70 | HR 72 | Temp 98.3°F | Ht 63.0 in | Wt 130.0 lb

## 2023-07-24 DIAGNOSIS — L910 Hypertrophic scar: Secondary | ICD-10-CM | POA: Insufficient documentation

## 2023-07-24 DIAGNOSIS — E538 Deficiency of other specified B group vitamins: Secondary | ICD-10-CM | POA: Diagnosis not present

## 2023-07-24 DIAGNOSIS — S81811D Laceration without foreign body, right lower leg, subsequent encounter: Secondary | ICD-10-CM

## 2023-07-24 DIAGNOSIS — S86001D Unspecified injury of right Achilles tendon, subsequent encounter: Secondary | ICD-10-CM | POA: Diagnosis not present

## 2023-07-24 NOTE — Patient Instructions (Addendum)
 Blue-Emu cream -- use 2-3 times a day Mederma scar sheets Diabetic socks 1/4 inch heel lift

## 2023-07-24 NOTE — Assessment & Plan Note (Signed)
 Keloid, ankle pain Blue-Emu cream -- use 2-3 times a day Mederma scar sheets Diabetic socks 1/4 inch heel lift  Start PT

## 2023-07-24 NOTE — Progress Notes (Signed)
 L ankle  Subjective:  Patient ID: Amanda Cole, female    DOB: September 02, 1940  Age: 83 y.o. MRN: [id number]  CC: Medical Management of Chronic Issues (4*6 week f/u )   HPI Amanda Cole presents for the injury of the R ankle. C/o pain in the R achilles tendon  Outpatient Medications Prior to Visit  Medication Sig Dispense Refill   cephALEXin (KEFLEX) 500 MG capsule Take 1 capsule (500 mg total) by mouth 3 (three) times daily. 9 capsule 1   Cholecalciferol (VITAMIN D3) 50 MCG (2000 UT) capsule Take 1 capsule (2,000 Units total) by mouth daily. 100 capsule 3   conjugated estrogens (PREMARIN) vaginal cream Use PV as directed (1 applicator Vaginal Daily PRN, Dryness) 42.5 g 3   Multiple Vitamins-Minerals (ONE-A-DAY 50 PLUS PO) Take 1 tablet by mouth daily.     mupirocin ointment (BACTROBAN) 2 % On leg wound w/dressing change qd or bid 30 g 0   zolpidem (AMBIEN) 5 MG tablet Take 1 tablet (5 mg total) by mouth at bedtime as needed for sleep. 30 tablet 1   HYDROcodone-acetaminophen (NORCO/VICODIN) 5-325 MG tablet Take 1 tablet by mouth every 6 (six) hours as needed for severe pain (pain score 7-10). (Patient not taking: Reported on 07/24/2023) 20 tablet 0   No facility-administered medications prior to visit.    ROS: Review of Systems  Constitutional:  Negative for activity change, appetite change, chills, fatigue and unexpected weight change.  HENT:  Negative for congestion, mouth sores and sinus pressure.   Eyes:  Negative for visual disturbance.  Respiratory:  Negative for cough and chest tightness.   Gastrointestinal:  Negative for abdominal pain and nausea.  Genitourinary:  Negative for difficulty urinating, frequency and vaginal pain.  Musculoskeletal:  Positive for arthralgias and gait problem. Negative for back pain.  Skin:  Negative for pallor and rash.  Neurological:  Negative for dizziness, tremors, weakness, numbness and headaches.  Psychiatric/Behavioral:  Negative for  confusion, sleep disturbance and suicidal ideas. The patient is not nervous/anxious.     Objective:  BP 130/70   Pulse 72   Temp 98.3 F (36.8 C) (Oral)   Ht 5\' 3"  (1.6 m)   Wt 130 lb (59 kg)   SpO2 96%   BMI 23.03 kg/m   BP Readings from Last 3 Encounters:  07/24/23 130/70  06/25/23 138/82  06/19/23 120/78    Wt Readings from Last 3 Encounters:  07/24/23 130 lb (59 kg)  06/25/23 130 lb 8 oz (59.2 kg)  06/05/23 127 lb (57.6 kg)    Physical Exam Constitutional:      General: She is not in acute distress.    Appearance: Normal appearance. She is well-developed.  HENT:     Head: Normocephalic.     Right Ear: External ear normal.     Left Ear: External ear normal.     Nose: Nose normal.  Eyes:     General:        Right eye: No discharge.        Left eye: No discharge.     Conjunctiva/sclera: Conjunctivae normal.     Pupils: Pupils are equal, round, and reactive to light.  Neck:     Thyroid: No thyromegaly.     Vascular: No JVD.     Trachea: No tracheal deviation.  Cardiovascular:     Rate and Rhythm: Normal rate and regular rhythm.     Heart sounds: Normal heart sounds.  Pulmonary:     Effort:  No respiratory distress.     Breath sounds: No stridor. No wheezing.  Abdominal:     General: Bowel sounds are normal. There is no distension.     Palpations: Abdomen is soft. There is no mass.     Tenderness: There is no abdominal tenderness. There is no guarding or rebound.  Musculoskeletal:        General: Tenderness present.     Cervical back: Normal range of motion and neck supple. No rigidity.     Right lower leg: No edema.     Left lower leg: No edema.  Lymphadenopathy:     Cervical: No cervical adenopathy.  Skin:    Findings: Lesion present. No erythema or rash.  Neurological:     Cranial Nerves: No cranial nerve deficit.     Motor: No abnormal muscle tone.     Coordination: Coordination normal.     Deep Tendon Reflexes: Reflexes normal.  Psychiatric:         Behavior: Behavior normal.        Thought Content: Thought content normal.        Judgment: Judgment normal.    Thick scar U shaped - R posterior ankle  Pain in the R achilles tendon on palpation      Lab Results  Component Value Date   WBC 4.9 12/26/2022   HGB 13.3 12/26/2022   HCT 40.9 12/26/2022   PLT 176.0 12/26/2022   GLUCOSE 85 12/26/2022   CHOL 295 (H) 12/26/2022   TRIG 139.0 12/26/2022   HDL 54.60 12/26/2022   LDLDIRECT 161.5 03/29/2012   LDLCALC 213 (H) 12/26/2022   ALT 37 (H) 12/26/2022   AST 40 (H) 12/26/2022   NA 135 12/26/2022   K 4.0 12/26/2022   CL 103 12/26/2022   CREATININE 1.07 12/26/2022   BUN 18 12/26/2022   CO2 24 12/26/2022   TSH 1.89 12/26/2022   MICROALBUR <0.7 06/29/2020    No results found.  Assessment & Plan:   Problem List Items Addressed This Visit     Achilles tendon injury, right, subsequent encounter - Primary   Keloid, ankle pain Blue-Emu cream -- use 2-3 times a day Mederma scar sheets Diabetic socks 1/4 inch heel lift  Start PT      Relevant Orders   Ambulatory referral to Physical Therapy   Keloid scar   Keloid, ankle pain Blue-Emu cream -- use 2-3 times a day Mederma scar sheets Diabetic socks 1/4 inch heel lift  Start PT      Relevant Orders   Ambulatory referral to Physical Therapy   Leg laceration, right, subsequent encounter   Keloid, ankle pain Blue-Emu cream -- use 2-3 times a day Mederma scar sheets Diabetic socks 1/4 inch heel lift  Start PT      Relevant Orders   Ambulatory referral to Physical Therapy   Vitamin B12 deficiency   Continue with a multivitamin         No orders of the defined types were placed in this encounter.     Follow-up: Return in about 2 months (around 09/23/2023) for a follow-up visit.  Sonda Primes, MD

## 2023-07-24 NOTE — Assessment & Plan Note (Signed)
 Continue with a multivitamin

## 2023-07-30 ENCOUNTER — Ambulatory Visit: Payer: Medicare Other | Admitting: Internal Medicine

## 2023-07-30 DIAGNOSIS — L820 Inflamed seborrheic keratosis: Secondary | ICD-10-CM | POA: Diagnosis not present

## 2023-08-01 ENCOUNTER — Encounter: Payer: Self-pay | Admitting: Internal Medicine

## 2023-08-01 IMAGING — US US ABDOMEN LIMITED
1 series · 14 of 25 positions shown · non-contrast
Comparison: November 28, 2019

CLINICAL DATA: Elevated liver function tests.

EXAM:
ULTRASOUND ABDOMEN LIMITED RIGHT UPPER QUADRANT

[Series 1: us abdomen limited · 0.22mm/px · 14 of 74 slices shown]
[im 1/74]
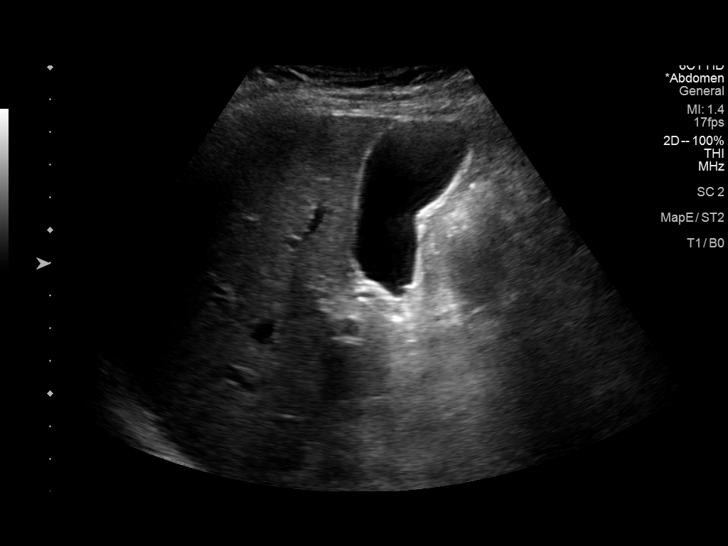
[im 7/74]
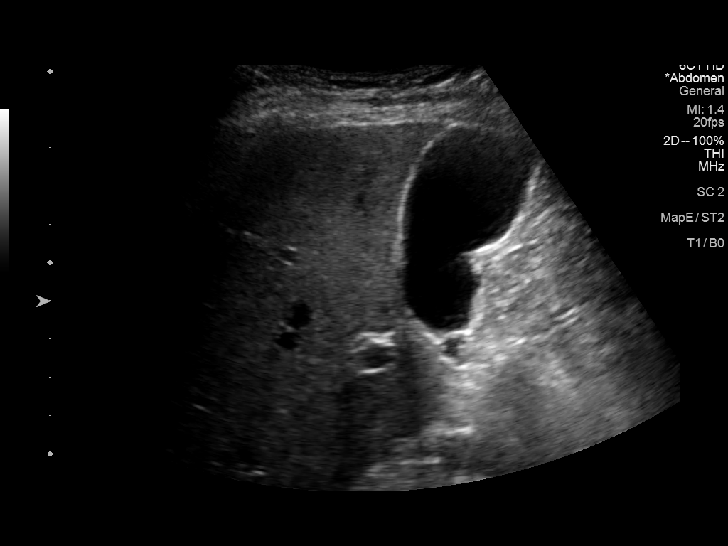
[im 13/74]
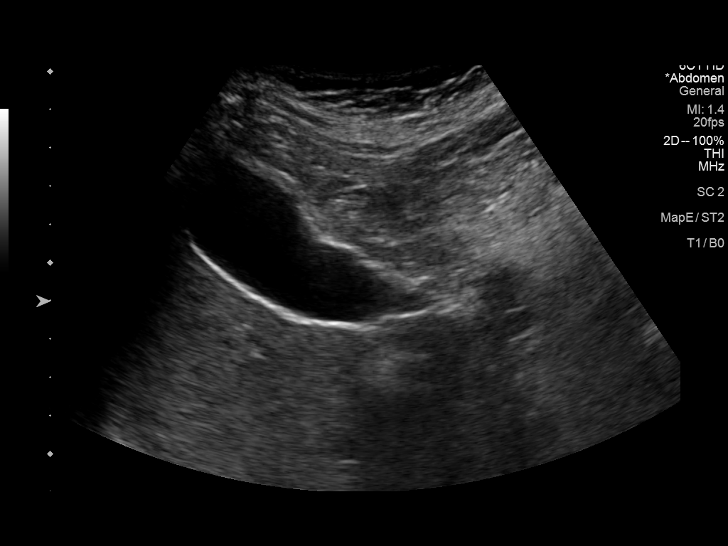
[im 19/74]
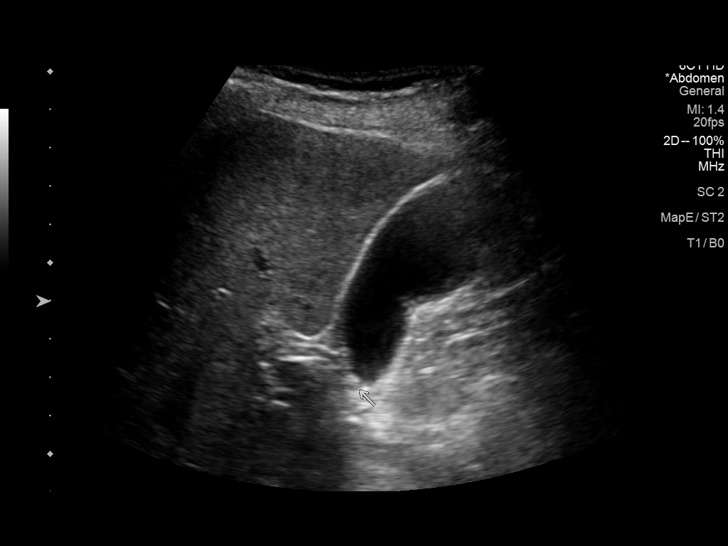
[im 25/74]
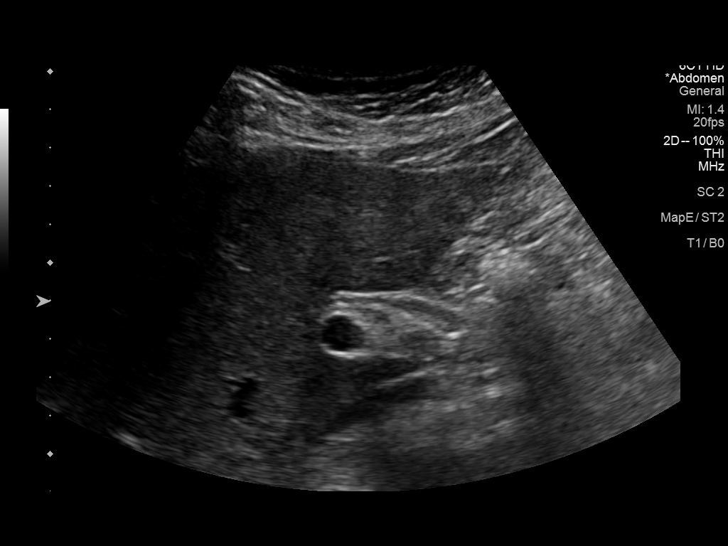
[im 28/74]
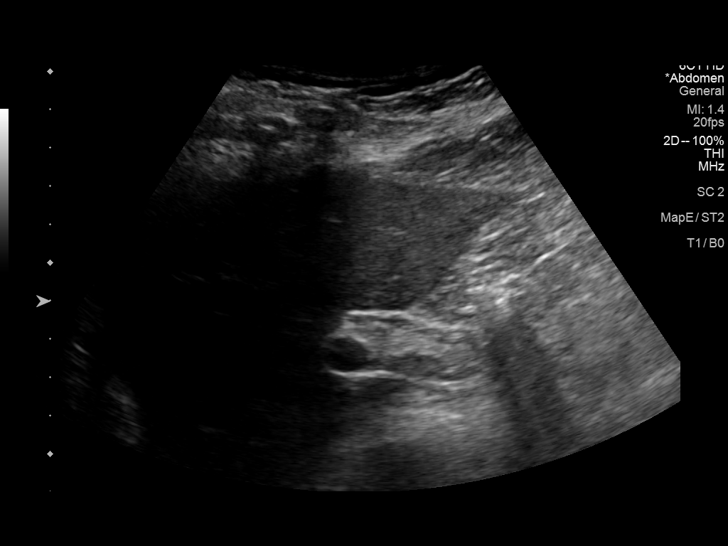
[im 34/74]
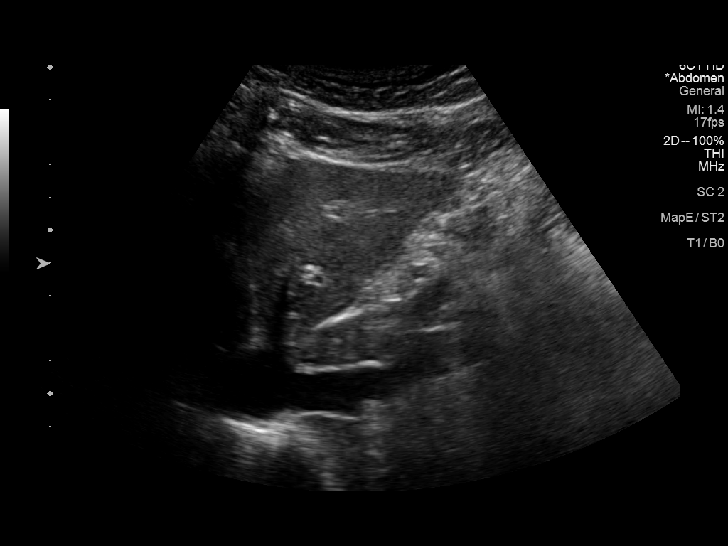
[im 40/74]
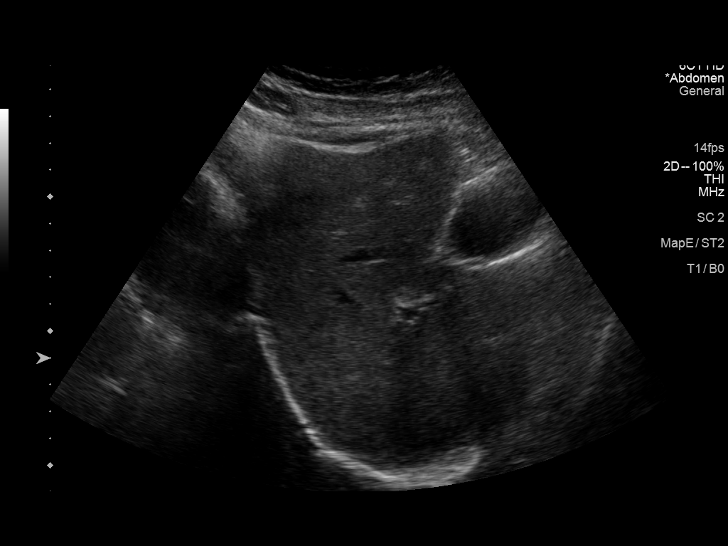
[im 46/74]
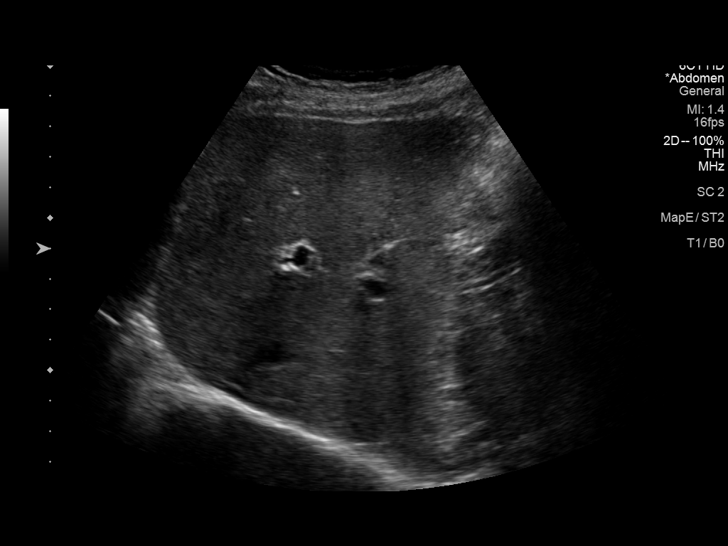
[im 49/74]
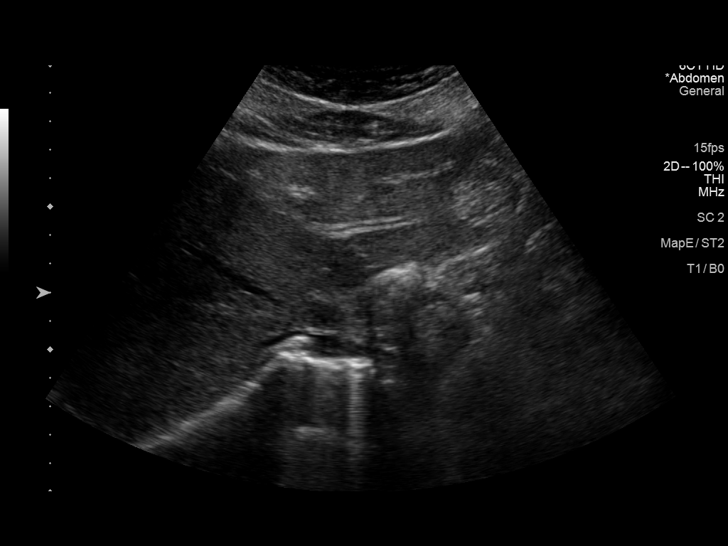
[im 55/74]
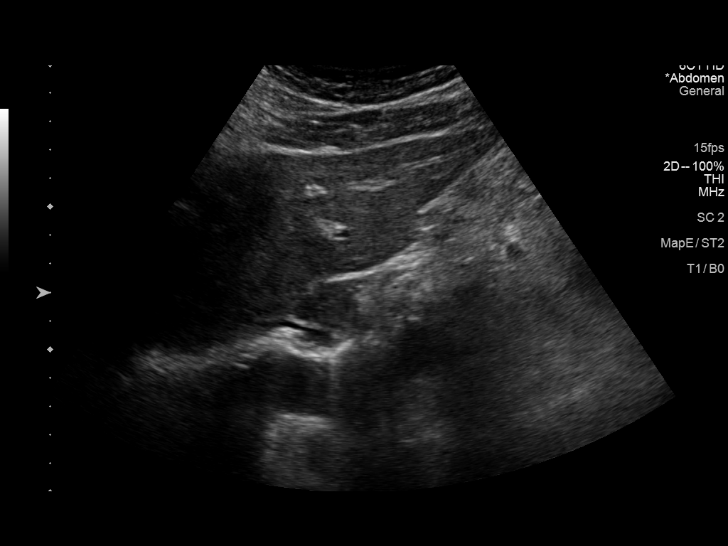
[im 61/74]
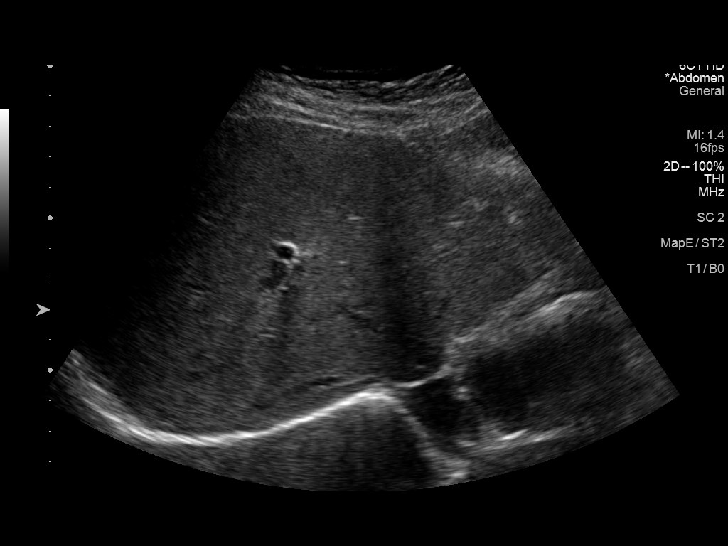
[im 67/74]
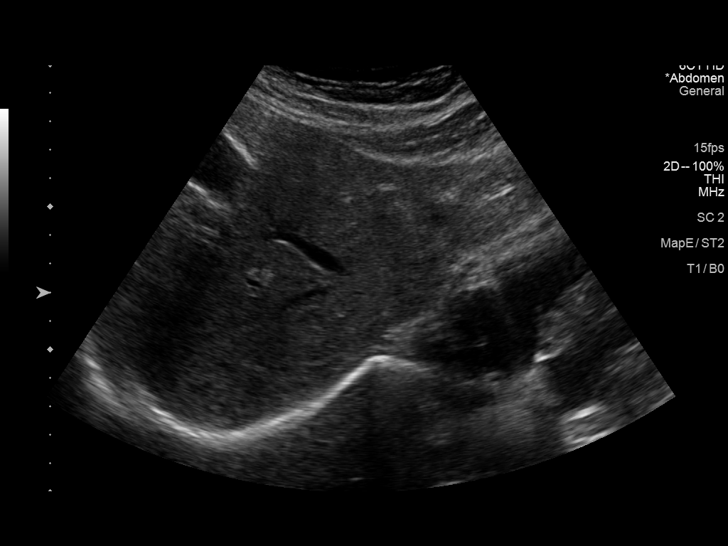
[im 74/74]
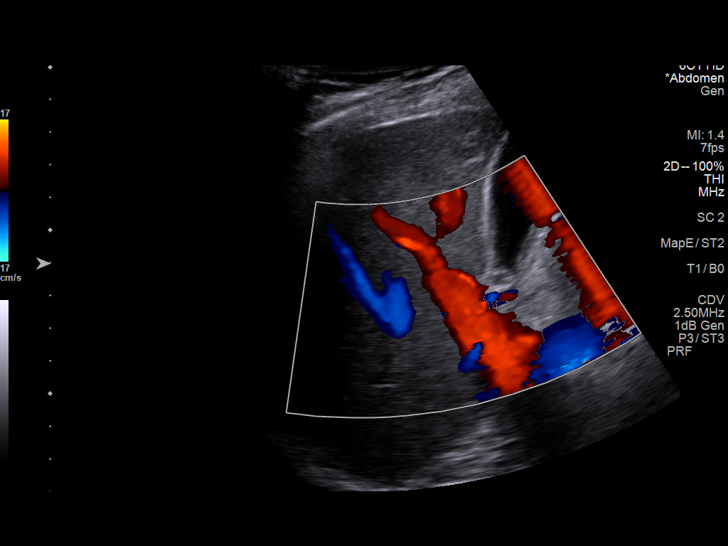

[14 of 25 positions shown; findings below may reference images not displayed]

FINDINGS: Gallbladder:

No gallstones or wall thickening visualized (2.0 mm). No sonographic
Murphy sign noted by sonographer.

Common bile duct:

Diameter: 4.2 mm

Liver:

No focal lesion identified. The liver parenchyma is heterogeneous in
appearance and nodular in contour. Mild intrahepatic biliary
dilatation is noted. Portal vein is patent on color Doppler imaging
with normal direction of blood flow towards the liver.

Other: None.
IMPRESSION: Heterogeneous, nodular echotexture of the liver and mild
intrahepatic biliary dilatation, without focal liver lesions.

## 2023-08-22 NOTE — Telephone Encounter (Signed)
 Copied from CRM [id number]. Topic: General - Other >> Aug 22, 2023  2:59 PM Deaijah H wrote: Reason for CRM: Joelle w/ [employer] called in stating haven't received prior authorization for physical therapy. If needed, please call [phone] regarding PA

## 2023-08-28 ENCOUNTER — Telehealth: Payer: Self-pay | Admitting: Internal Medicine

## 2023-08-28 NOTE — Telephone Encounter (Signed)
 Copied from CRM [id number]. Topic: Clinical - Medical Advice >> Aug 28, 2023  3:50 PM Amanda Cole wrote: Reason for CRM: Patient is calling for an update in regards of physical therapy for her ankle. She still has not heard of anything yet.She would like someone to contact her soon as possible.  ---  Called pt and let her know that her referral is still pending, and once approved the facility preforming the PT will contact her to schedule. Pt expressed understanding.

## 2023-08-30 NOTE — Telephone Encounter (Signed)
 Payor called back to see if prior authorization was sent for [facility] for referral for PT. Advised still pending but sent to authorizations team. Would like expedited if possible and patient would like a call once authorized. Thank You

## 2023-09-04 NOTE — Telephone Encounter (Signed)
 Being addressed in 08/28/23 telephone note

## 2023-09-06 ENCOUNTER — Other Ambulatory Visit: Payer: Self-pay

## 2023-09-06 DIAGNOSIS — S86001D Unspecified injury of right Achilles tendon, subsequent encounter: Secondary | ICD-10-CM

## 2023-09-06 DIAGNOSIS — L910 Hypertrophic scar: Secondary | ICD-10-CM

## 2023-09-06 DIAGNOSIS — S81811D Laceration without foreign body, right lower leg, subsequent encounter: Secondary | ICD-10-CM

## 2023-09-06 NOTE — Telephone Encounter (Signed)
 Spoke with patient, provided her with PT's contact information

## 2023-09-17 ENCOUNTER — Other Ambulatory Visit: Payer: Self-pay

## 2023-09-17 ENCOUNTER — Encounter: Payer: Self-pay | Admitting: Physical Therapy

## 2023-09-17 ENCOUNTER — Ambulatory Visit: Admitting: Physical Therapy

## 2023-09-17 DIAGNOSIS — M25571 Pain in right ankle and joints of right foot: Secondary | ICD-10-CM

## 2023-09-17 DIAGNOSIS — M6281 Muscle weakness (generalized): Secondary | ICD-10-CM

## 2023-09-17 DIAGNOSIS — R2689 Other abnormalities of gait and mobility: Secondary | ICD-10-CM
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
## 2023-09-17 NOTE — Patient Instructions (Signed)
 Access Code: [email or web address] URL: [email or web address] Date: 09/17/2023 Prepared by: Leah Primus  Exercises - Long Sitting Calf Stretch with Strap  - 2 x daily - 3 reps - 30 seconds hold - Long Sitting Ankle Plantar Flexion with Resistance  - 2 x daily - 2 sets - 10 reps - Medial Ankle Scar Massage  - 1-2 x daily

## 2023-09-25 ENCOUNTER — Ambulatory Visit: Admitting: Internal Medicine

## 2023-10-01 ENCOUNTER — Ambulatory Visit: Admitting: Physical Therapy

## 2023-10-01 ENCOUNTER — Other Ambulatory Visit: Payer: Self-pay

## 2023-10-01 ENCOUNTER — Encounter: Payer: Self-pay | Admitting: Physical Therapy

## 2023-10-01 DIAGNOSIS — M6281 Muscle weakness (generalized): Secondary | ICD-10-CM

## 2023-10-01 DIAGNOSIS — M25571 Pain in right ankle and joints of right foot: Secondary | ICD-10-CM

## 2023-10-01 DIAGNOSIS — R2689 Other abnormalities of gait and mobility: Secondary | ICD-10-CM | POA: Diagnosis not present
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
## 2023-10-09 ENCOUNTER — Ambulatory Visit: Admitting: Physical Therapy

## 2023-10-09 ENCOUNTER — Encounter: Payer: Self-pay | Admitting: Physical Therapy

## 2023-10-09 ENCOUNTER — Other Ambulatory Visit: Payer: Self-pay

## 2023-10-09 DIAGNOSIS — M6281 Muscle weakness (generalized): Secondary | ICD-10-CM | POA: Diagnosis not present

## 2023-10-09 DIAGNOSIS — M25571 Pain in right ankle and joints of right foot: Secondary | ICD-10-CM

## 2023-10-09 DIAGNOSIS — R2689 Other abnormalities of gait and mobility: Secondary | ICD-10-CM
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
## 2023-10-09 NOTE — Patient Instructions (Signed)
 Access Code: [email or web address] URL: [email or web address] Date: 10/09/2023 Prepared by: Leah Primus  Exercises - Long Sitting Ankle Plantar Flexion with Resistance  - 2 x daily - 2 sets - 15 reps - Medial Ankle Scar Massage  - 1-2 x daily - Standing Gastroc Stretch at Counter  - 2 x daily - 3 reps - 30 seconds hold - Heel Raises with Counter Support  - 2 x daily - 10 reps - 3 seconds hold

## 2023-10-15 ENCOUNTER — Encounter: Payer: Self-pay | Admitting: Physical Therapy

## 2023-10-15 ENCOUNTER — Other Ambulatory Visit: Payer: Self-pay

## 2023-10-15 ENCOUNTER — Ambulatory Visit: Admitting: Physical Therapy

## 2023-10-15 DIAGNOSIS — R2689 Other abnormalities of gait and mobility: Secondary | ICD-10-CM | POA: Diagnosis not present

## 2023-10-15 DIAGNOSIS — M6281 Muscle weakness (generalized): Secondary | ICD-10-CM

## 2023-10-15 DIAGNOSIS — M25571 Pain in right ankle and joints of right foot: Secondary | ICD-10-CM

## 2023-10-15 NOTE — Patient Instructions (Signed)
 Access Code: [email or web address] URL: [email or web address] Date: 10/15/2023 Prepared by: Leah Primus  Exercises - Long Sitting Ankle Plantar Flexion with Resistance  - 2 x daily - 2 sets - 15 reps - Medial Ankle Scar Massage  - 1-2 x daily - Standing Gastroc Stretch at Counter  - 2 x daily - 3 reps - 30 seconds hold - Heel Raises with Counter Support  - 2 x daily - 10 reps - 3 seconds hold - Standing Tandem Balance with Counter Support  - 1 x daily - 3 reps - 30 seconds hold - Standing Single Leg Stance with Counter Support  - 1 x daily - 3 reps - 30 seconds hold
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
## 2023-10-22 ENCOUNTER — Ambulatory Visit (INDEPENDENT_AMBULATORY_CARE_PROVIDER_SITE_OTHER): Admitting: Internal Medicine

## 2023-10-22 ENCOUNTER — Encounter: Payer: Self-pay | Admitting: Physical Therapy

## 2023-10-22 ENCOUNTER — Encounter: Payer: Self-pay | Admitting: Internal Medicine

## 2023-10-22 ENCOUNTER — Other Ambulatory Visit: Payer: Self-pay

## 2023-10-22 ENCOUNTER — Ambulatory Visit: Admitting: Physical Therapy

## 2023-10-22 VITALS — BP 114/78 | HR 68 | Temp 98.0°F | Ht 63.0 in | Wt 130.0 lb

## 2023-10-22 DIAGNOSIS — M25571 Pain in right ankle and joints of right foot: Secondary | ICD-10-CM | POA: Diagnosis not present

## 2023-10-22 DIAGNOSIS — S81811D Laceration without foreign body, right lower leg, subsequent encounter: Secondary | ICD-10-CM

## 2023-10-22 DIAGNOSIS — E538 Deficiency of other specified B group vitamins: Secondary | ICD-10-CM | POA: Diagnosis not present

## 2023-10-22 DIAGNOSIS — R2689 Other abnormalities of gait and mobility: Secondary | ICD-10-CM | POA: Diagnosis not present

## 2023-10-22 DIAGNOSIS — N183 Chronic kidney disease, stage 3 unspecified: Secondary | ICD-10-CM

## 2023-10-22 DIAGNOSIS — M6281 Muscle weakness (generalized): Secondary | ICD-10-CM | POA: Diagnosis not present

## 2023-10-22 DIAGNOSIS — L910 Hypertrophic scar: Secondary | ICD-10-CM

## 2023-10-22 DIAGNOSIS — M1991 Primary osteoarthritis, unspecified site: Secondary | ICD-10-CM | POA: Insufficient documentation

## 2023-10-22 MED ORDER — HYDROCODONE-ACETAMINOPHEN 5-325 MG PO TABS: 1.0000 | ORAL_TABLET | Freq: Four times a day (QID) | ORAL | 0 refills | Status: AC | PRN

## 2023-10-22 NOTE — Assessment & Plan Note (Signed)
 Keloid, ankle pain Blue-Emu cream -- use 2-3 times a day Mederma scar sheets In PT Norco prn - w/caution  Potential benefits of a short/long term opioids use as well as potential risks (i.e. addiction risk, apnea etc) and complications (i.e. Somnolence, constipation and others) were explained to the patient and were aknowledged.

## 2023-10-22 NOTE — Assessment & Plan Note (Signed)
Stable GFR 52-53 -- Stage 3a Cont to hydrate well

## 2023-10-22 NOTE — Progress Notes (Signed)
 Subjective:  Patient ID: Amanda Cole, female    DOB: 1940/12/11  Age: 83 y.o. MRN: [id number]  CC: Medical Management of Chronic Issues (2 mnth f/u )   HPI Amanda Cole presents for R ankle pain, wound - PT is helping w/ROM, pain. C/o OA pain, B12 def.  Outpatient Medications Prior to Visit  Medication Sig Dispense Refill   Cholecalciferol  (VITAMIN D3) 50 MCG (2000 UT) capsule Take 1 capsule (2,000 Units total) by mouth daily. 100 capsule 3   conjugated estrogens  (PREMARIN ) vaginal cream Use PV as directed (1 applicator Vaginal Daily PRN, Dryness) 42.5 g 3   Multiple Vitamins-Minerals (ONE-A-DAY 50 PLUS PO) Take 1 tablet by mouth daily.     mupirocin  ointment (BACTROBAN ) 2 % On leg wound w/dressing change qd or bid 30 g 0   zolpidem  (AMBIEN ) 5 MG tablet Take 1 tablet (5 mg total) by mouth at bedtime as needed for sleep. 30 tablet 1   HYDROcodone -acetaminophen  (NORCO/VICODIN) 5-325 MG tablet Take 1 tablet by mouth every 6 (six) hours as needed for severe pain (pain score 7-10). 20 tablet 0   cephALEXin  (KEFLEX ) 500 MG capsule Take 1 capsule (500 mg total) by mouth 3 (three) times daily. (Patient not taking: Reported on 10/22/2023) 9 capsule 1   No facility-administered medications prior to visit.    ROS: Review of Systems  Constitutional:  Negative for activity change, appetite change, chills, fatigue and unexpected weight change.  HENT:  Negative for congestion, mouth sores and sinus pressure.   Eyes:  Negative for visual disturbance.  Respiratory:  Negative for cough and chest tightness.   Gastrointestinal:  Negative for abdominal pain and nausea.  Genitourinary:  Negative for difficulty urinating, frequency and vaginal pain.  Musculoskeletal:  Positive for arthralgias. Negative for back pain and gait problem.  Skin:  Positive for color change. Negative for pallor, rash and wound.  Neurological:  Negative for dizziness, tremors, weakness, numbness and headaches.   Psychiatric/Behavioral:  Negative for confusion and sleep disturbance.     Objective:  BP 114/78   Pulse 68   Temp 98 F (36.7 C) (Oral)   Ht 5\' 3"  (1.6 m)   Wt 130 lb (59 kg)   SpO2 95%   BMI 23.03 kg/m   BP Readings from Last 3 Encounters:  10/22/23 114/78  07/24/23 130/70  06/25/23 138/82    Wt Readings from Last 3 Encounters:  10/22/23 130 lb (59 kg)  07/24/23 130 lb (59 kg)  06/25/23 130 lb 8 oz (59.2 kg)    Physical Exam Constitutional:      General: She is not in acute distress.    Appearance: She is well-developed. She is obese.  HENT:     Head: Normocephalic.     Right Ear: External ear normal.     Left Ear: External ear normal.     Nose: Nose normal.  Eyes:     General:        Right eye: No discharge.        Left eye: No discharge.     Conjunctiva/sclera: Conjunctivae normal.     Pupils: Pupils are equal, round, and reactive to light.  Neck:     Thyroid : No thyromegaly.     Vascular: No JVD.     Trachea: No tracheal deviation.  Cardiovascular:     Rate and Rhythm: Normal rate and regular rhythm.     Heart sounds: Normal heart sounds.  Pulmonary:     Effort: No  respiratory distress.     Breath sounds: No stridor. No wheezing.  Abdominal:     General: Bowel sounds are normal. There is no distension.     Palpations: Abdomen is soft. There is no mass.     Tenderness: There is no abdominal tenderness. There is no guarding or rebound.  Musculoskeletal:        General: No tenderness.     Cervical back: Normal range of motion and neck supple. No rigidity.     Right lower leg: No edema.     Left lower leg: No edema.  Lymphadenopathy:     Cervical: No cervical adenopathy.  Skin:    Findings: No erythema or rash.  Neurological:     Mental Status: She is oriented to person, place, and time.     Cranial Nerves: No cranial nerve deficit.     Motor: No abnormal muscle tone.     Coordination: Coordination normal.     Deep Tendon Reflexes: Reflexes  normal.  Psychiatric:        Behavior: Behavior normal.        Thought Content: Thought content normal.        Judgment: Judgment normal.   R ankle is sensitive - w/a scar  Lab Results  Component Value Date   WBC 4.9 12/26/2022   HGB 13.3 12/26/2022   HCT 40.9 12/26/2022   PLT 176.0 12/26/2022   GLUCOSE 85 12/26/2022   CHOL 295 (H) 12/26/2022   TRIG 139.0 12/26/2022   HDL 54.60 12/26/2022   LDLDIRECT 161.5 03/29/2012   LDLCALC 213 (H) 12/26/2022   ALT 37 (H) 12/26/2022   AST 40 (H) 12/26/2022   NA 135 12/26/2022   K 4.0 12/26/2022   CL 103 12/26/2022   CREATININE 1.07 12/26/2022   BUN 18 12/26/2022   CO2 24 12/26/2022   TSH 1.89 12/26/2022   MICROALBUR <0.7 06/29/2020    No results found.  Assessment & Plan:   Problem List Items Addressed This Visit     Vitamin B12 deficiency - Primary   Continue with a multivitamin      Chronic renal insufficiency, stage 3 (moderate) (HCC)   Stable GFR 52-53 -- Stage 3a Cont to hydrate well      Leg laceration, right, subsequent encounter   Keloid, ankle pain Blue-Emu cream -- use 2-3 times a day Mederma scar sheets In PT Norco prn - w/caution  Potential benefits of a short/long term opioids use as well as potential risks (i.e. addiction risk, apnea etc) and complications (i.e. Somnolence, constipation and others) were explained to the patient and were aknowledged.       Keloid scar   Mederma film Massage Use elastic ankle brace for activities         Meds ordered this encounter  Medications   HYDROcodone -acetaminophen  (NORCO/VICODIN) 5-325 MG tablet    Sig: Take 1 tablet by mouth every 6 (six) hours as needed for severe pain (pain score 7-10).    Dispense:  20 tablet    Refill:  0      Follow-up: Return in about 4 weeks (around 11/19/2023) for a follow-up visit.  Anitra Barn, MD
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
## 2023-10-22 NOTE — Assessment & Plan Note (Signed)
 Mederma film Massage Use elastic ankle brace for activities

## 2023-10-22 NOTE — Assessment & Plan Note (Signed)
 Continue with a multivitamin

## 2023-10-22 NOTE — Patient Instructions (Signed)
 Use elastic ankle brace for activities

## 2023-11-12 ENCOUNTER — Encounter: Payer: Self-pay | Admitting: Physical Therapy

## 2023-11-12 ENCOUNTER — Ambulatory Visit: Admitting: Physical Therapy

## 2023-11-12 ENCOUNTER — Other Ambulatory Visit: Payer: Self-pay

## 2023-11-12 DIAGNOSIS — R2689 Other abnormalities of gait and mobility: Secondary | ICD-10-CM

## 2023-11-12 DIAGNOSIS — M6281 Muscle weakness (generalized): Secondary | ICD-10-CM

## 2023-11-12 DIAGNOSIS — M25571 Pain in right ankle and joints of right foot: Secondary | ICD-10-CM | POA: Diagnosis not present
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
## 2023-11-12 NOTE — Patient Instructions (Signed)
 Access Code: [email or web address] URL: [email or web address] Date: 11/12/2023 Prepared by: Elaine Daring  Exercises - Long Sitting Ankle Plantar Flexion with Resistance  - 1 x daily - 2 sets - 15 reps - Medial Ankle Scar Massage  - 1-2 x daily - Standing Gastroc Stretch at Counter  - 1 x daily - 3 reps - 30 seconds hold - Heel Raises with Counter Support  - 1 x daily - 2 sets - 10 reps - 3 seconds hold - Standing Tandem Balance with Counter Support  - 1 x daily - 3 reps - 30 seconds hold - Standing Single Leg Stance with Counter Support  - 1 x daily - 3 reps - 30 seconds hold

## 2023-11-19 ENCOUNTER — Ambulatory Visit (INDEPENDENT_AMBULATORY_CARE_PROVIDER_SITE_OTHER): Admitting: Internal Medicine

## 2023-11-19 ENCOUNTER — Encounter: Payer: Self-pay | Admitting: Internal Medicine

## 2023-11-19 VITALS — BP 118/70 | HR 66 | Temp 97.8°F | Ht 63.0 in | Wt 129.0 lb

## 2023-11-19 DIAGNOSIS — R300 Dysuria: Secondary | ICD-10-CM | POA: Diagnosis not present

## 2023-11-19 DIAGNOSIS — S86001D Unspecified injury of right Achilles tendon, subsequent encounter: Secondary | ICD-10-CM | POA: Diagnosis not present

## 2023-11-19 DIAGNOSIS — L910 Hypertrophic scar: Secondary | ICD-10-CM

## 2023-11-19 DIAGNOSIS — E538 Deficiency of other specified B group vitamins: Secondary | ICD-10-CM | POA: Diagnosis not present

## 2023-11-19 LAB — URINALYSIS, ROUTINE W REFLEX MICROSCOPIC
Bilirubin Urine: NEGATIVE
Ketones, ur: NEGATIVE
Nitrite: POSITIVE — AB
Specific Gravity, Urine: <=1.005 — AB (ref 1.000–1.030)
Total Protein, Urine: NEGATIVE
Urine Glucose: NEGATIVE
Urobilinogen, UA: 0.2 (ref 0.0–1.0)
pH: 6 (ref 5.0–8.0)

## 2023-11-19 MED ORDER — CEPHALEXIN 500 MG PO CAPS: 500.0000 mg | ORAL_CAPSULE | Freq: Four times a day (QID) | ORAL | 0 refills | Status: AC

## 2023-11-19 NOTE — Assessment & Plan Note (Addendum)
 A little better - finishing PT Use infrared light mat for Keloid, ankle pain

## 2023-11-19 NOTE — Assessment & Plan Note (Addendum)
 A little better Use infrared light mat

## 2023-11-19 NOTE — Patient Instructions (Signed)
 Infrared light mat

## 2023-11-19 NOTE — Assessment & Plan Note (Signed)
 Continue with a multivitamin

## 2023-11-19 NOTE — Progress Notes (Signed)
 Subjective:  Patient ID: Amanda Cole, female    DOB: 18-May-1940  Age: 83 y.o. MRN: [id number]  CC: Medical Management of Chronic Issues (4 week f/u... Pt states she has been having Dysuria, frequency, urgency and pressure in bladder x4 days)   HPI AUBREANNA PERCLE presents for keloid scar, R ankle stiffness - in PT  Outpatient Medications Prior to Visit  Medication Sig Dispense Refill   Cholecalciferol  (VITAMIN D3) 50 MCG (2000 UT) capsule Take 1 capsule (2,000 Units total) by mouth daily. 100 capsule 3   conjugated estrogens  (PREMARIN ) vaginal cream Use PV as directed (1 applicator Vaginal Daily PRN, Dryness) 42.5 g 3   HYDROcodone -acetaminophen  (NORCO/VICODIN) 5-325 MG tablet Take 1 tablet by mouth every 6 (six) hours as needed for severe pain (pain score 7-10). 20 tablet 0   Multiple Vitamins-Minerals (ONE-A-DAY 50 PLUS PO) Take 1 tablet by mouth daily.     mupirocin  ointment (BACTROBAN ) 2 % On leg wound w/dressing change qd or bid 30 g 0   zolpidem  (AMBIEN ) 5 MG tablet Take 1 tablet (5 mg total) by mouth at bedtime as needed for sleep. 30 tablet 1   No facility-administered medications prior to visit.    ROS: Review of Systems  Constitutional:  Negative for activity change, appetite change, chills, fatigue and unexpected weight change.  HENT:  Negative for congestion, mouth sores and sinus pressure.   Eyes:  Negative for visual disturbance.  Respiratory:  Negative for cough and chest tightness.   Gastrointestinal:  Negative for abdominal pain and nausea.  Genitourinary:  Negative for difficulty urinating, frequency and vaginal pain.  Musculoskeletal:  Positive for arthralgias, back pain and gait problem.  Skin:  Positive for color change. Negative for pallor and rash.  Neurological:  Negative for dizziness, tremors, weakness, numbness and headaches.  Psychiatric/Behavioral:  Negative for confusion and sleep disturbance.     Objective:  BP 118/70   Pulse 66   Temp 97.8  F (36.6 C) (Oral)   Ht 5' 3 (1.6 m)   Wt 129 lb (58.5 kg)   SpO2 97%   BMI 22.85 kg/m   BP Readings from Last 3 Encounters:  11/19/23 118/70  10/22/23 114/78  07/24/23 130/70    Wt Readings from Last 3 Encounters:  11/19/23 129 lb (58.5 kg)  10/22/23 130 lb (59 kg)  07/24/23 130 lb (59 kg)    Physical Exam Constitutional:      General: She is not in acute distress.    Appearance: Normal appearance. She is well-developed.  HENT:     Head: Normocephalic.     Right Ear: External ear normal.     Left Ear: External ear normal.     Nose: Nose normal.  Eyes:     General:        Right eye: No discharge.        Left eye: No discharge.     Conjunctiva/sclera: Conjunctivae normal.     Pupils: Pupils are equal, round, and reactive to light.  Neck:     Thyroid : No thyromegaly.     Vascular: No JVD.     Trachea: No tracheal deviation.  Cardiovascular:     Rate and Rhythm: Normal rate and regular rhythm.     Heart sounds: Normal heart sounds.  Pulmonary:     Effort: No respiratory distress.     Breath sounds: No stridor. No wheezing.  Abdominal:     General: Bowel sounds are normal. There is no distension.  Palpations: Abdomen is soft. There is no mass.     Tenderness: There is no abdominal tenderness. There is no guarding or rebound.  Musculoskeletal:        General: No tenderness.     Cervical back: Normal range of motion and neck supple. No rigidity.  Lymphadenopathy:     Cervical: No cervical adenopathy.  Skin:    Findings: No erythema or rash.  Neurological:     Cranial Nerves: No cranial nerve deficit.     Motor: No abnormal muscle tone.     Coordination: Coordination normal.     Deep Tendon Reflexes: Reflexes normal.  Psychiatric:        Behavior: Behavior normal.        Thought Content: Thought content normal.        Judgment: Judgment normal.    Keloid scar over R Achilles tendon, sensitive area   Lab Results  Component Value Date   WBC 4.9  12/26/2022   HGB 13.3 12/26/2022   HCT 40.9 12/26/2022   PLT 176.0 12/26/2022   GLUCOSE 85 12/26/2022   CHOL 295 (H) 12/26/2022   TRIG 139.0 12/26/2022   HDL 54.60 12/26/2022   LDLDIRECT 161.5 03/29/2012   LDLCALC 213 (H) 12/26/2022   ALT 37 (H) 12/26/2022   AST 40 (H) 12/26/2022   NA 135 12/26/2022   K 4.0 12/26/2022   CL 103 12/26/2022   CREATININE 1.07 12/26/2022   BUN 18 12/26/2022   CO2 24 12/26/2022   TSH 1.89 12/26/2022    No results found.  Assessment & Plan:   Problem List Items Addressed This Visit     Vitamin B12 deficiency   Continue with a multivitamin      Dysuria   Relevant Orders   Urinalysis   Achilles tendon injury, right, subsequent encounter - Primary   A little better - finishing PT Use infrared light mat for Keloid, ankle pain       Keloid scar   A little better Use infrared light mat         Meds ordered this encounter  Medications   cephALEXin  (KEFLEX ) 500 MG capsule    Sig: Take 1 capsule (500 mg total) by mouth 4 (four) times daily.    Dispense:  28 capsule    Refill:  0      Follow-up: Return in about 3 months (around 02/19/2024) for a follow-up visit.  Marolyn Noel, MD

## 2023-11-20 ENCOUNTER — Ambulatory Visit: Payer: Self-pay | Admitting: Internal Medicine

## 2023-12-05 DIAGNOSIS — C44519 Basal cell carcinoma of skin of other part of trunk: Secondary | ICD-10-CM | POA: Diagnosis not present

## 2023-12-05 DIAGNOSIS — C44619 Basal cell carcinoma of skin of left upper limb, including shoulder: Secondary | ICD-10-CM | POA: Diagnosis not present

## 2024-01-02 DIAGNOSIS — L570 Actinic keratosis: Secondary | ICD-10-CM | POA: Diagnosis not present

## 2024-01-02 DIAGNOSIS — Z08 Encounter for follow-up examination after completed treatment for malignant neoplasm: Secondary | ICD-10-CM | POA: Diagnosis not present

## 2024-01-02 DIAGNOSIS — X32XXXD Exposure to sunlight, subsequent encounter: Secondary | ICD-10-CM | POA: Diagnosis not present

## 2024-01-02 DIAGNOSIS — Z85828 Personal history of other malignant neoplasm of skin: Secondary | ICD-10-CM | POA: Diagnosis not present

## 2024-02-26 ENCOUNTER — Ambulatory Visit: Admitting: Internal Medicine

## 2024-02-26 ENCOUNTER — Encounter: Payer: Self-pay | Admitting: Internal Medicine

## 2024-02-26 ENCOUNTER — Telehealth: Payer: Self-pay

## 2024-02-26 VITALS — BP 126/82 | HR 67 | Temp 98.0°F | Ht 63.0 in | Wt 129.6 lb

## 2024-02-26 DIAGNOSIS — E538 Deficiency of other specified B group vitamins: Secondary | ICD-10-CM | POA: Diagnosis not present

## 2024-02-26 DIAGNOSIS — M19049 Primary osteoarthritis, unspecified hand: Secondary | ICD-10-CM | POA: Insufficient documentation

## 2024-02-26 DIAGNOSIS — L910 Hypertrophic scar: Secondary | ICD-10-CM

## 2024-02-26 DIAGNOSIS — S86001D Unspecified injury of right Achilles tendon, subsequent encounter: Secondary | ICD-10-CM | POA: Diagnosis not present

## 2024-02-26 DIAGNOSIS — N2889 Other specified disorders of kidney and ureter: Secondary | ICD-10-CM

## 2024-02-26 DIAGNOSIS — M19042 Primary osteoarthritis, left hand: Secondary | ICD-10-CM | POA: Diagnosis not present

## 2024-02-26 DIAGNOSIS — M19041 Primary osteoarthritis, right hand: Secondary | ICD-10-CM | POA: Diagnosis not present

## 2024-02-26 LAB — CBC WITH DIFFERENTIAL/PLATELET
Basophils Absolute: 0 K/uL (ref 0.0–0.1)
Basophils Relative: 1 % (ref 0.0–3.0)
Eosinophils Absolute: 0.1 K/uL (ref 0.0–0.7)
Eosinophils Relative: 3 % (ref 0.0–5.0)
HCT: 41.6 % (ref 36.0–46.0)
Hemoglobin: 13.6 g/dL (ref 12.0–15.0)
Lymphocytes Relative: 29.9 % (ref 12.0–46.0)
Lymphs Abs: 1.4 K/uL (ref 0.7–4.0)
MCHC: 32.8 g/dL (ref 30.0–36.0)
MCV: 87.8 fl (ref 78.0–100.0)
Monocytes Absolute: 0.6 K/uL (ref 0.1–1.0)
Monocytes Relative: 12.7 % — ABNORMAL HIGH (ref 3.0–12.0)
Neutro Abs: 2.5 K/uL (ref 1.4–7.7)
Neutrophils Relative %: 53.4 % (ref 43.0–77.0)
Platelets: 203 K/uL (ref 150.0–400.0)
RBC: 4.73 Mil/uL (ref 3.87–5.11)
RDW: 14.1 % (ref 11.5–15.5)
WBC: 4.6 K/uL (ref 4.0–10.5)

## 2024-02-26 LAB — COMPREHENSIVE METABOLIC PANEL WITH GFR
ALT: 32 U/L (ref 0–35)
AST: 34 U/L (ref 0–37)
Albumin: 4.2 g/dL (ref 3.5–5.2)
Alkaline Phosphatase: 74 U/L (ref 39–117)
BUN: 17 mg/dL (ref 6–23)
CO2: 26 meq/L (ref 19–32)
Calcium: 9.2 mg/dL (ref 8.4–10.5)
Chloride: 103 meq/L (ref 96–112)
Creatinine, Ser: 1.3 mg/dL — ABNORMAL HIGH (ref 0.40–1.20)
GFR: 37.55 mL/min — ABNORMAL LOW (ref >60.00)
Glucose, Bld: 93 mg/dL (ref 70–99)
Potassium: 4.7 meq/L (ref 3.5–5.1)
Sodium: 136 meq/L (ref 135–145)
Total Bilirubin: 0.5 mg/dL (ref 0.2–1.2)
Total Protein: 8 g/dL (ref 6.0–8.3)

## 2024-02-26 LAB — LIPID PANEL
Cholesterol: 251 mg/dL — ABNORMAL HIGH (ref 0–200)
HDL: 50.1 mg/dL (ref >39.00)
LDL Cholesterol: 168 mg/dL — ABNORMAL HIGH (ref 0–99)
NonHDL: 200.61
Total CHOL/HDL Ratio: 5
Triglycerides: 161 mg/dL — ABNORMAL HIGH (ref 0.0–149.0)
VLDL: 32.2 mg/dL (ref 0.0–40.0)

## 2024-02-26 LAB — URINALYSIS, ROUTINE W REFLEX MICROSCOPIC
Bilirubin Urine: NEGATIVE
Hgb urine dipstick: NEGATIVE
Ketones, ur: NEGATIVE
Nitrite: NEGATIVE
Specific Gravity, Urine: 1.01 (ref 1.000–1.030)
Total Protein, Urine: NEGATIVE
Urine Glucose: NEGATIVE
Urobilinogen, UA: 0.2 (ref 0.0–1.0)
pH: 5.5 (ref 5.0–8.0)

## 2024-02-26 LAB — TSH: TSH: 2.08 u[IU]/mL (ref 0.35–5.50)

## 2024-02-26 MED ORDER — PREMARIN 0.625 MG/GM VA CREA: TOPICAL_CREAM | VAGINAL | 3 refills | Status: DC

## 2024-02-26 NOTE — Assessment & Plan Note (Signed)
 Better

## 2024-02-26 NOTE — Assessment & Plan Note (Signed)
 Try Medicinal [place] Herbal Pills] Prince Natural Artemisia Annua Pills/??? ???? (Artemisia Annua/???) Vit D

## 2024-02-26 NOTE — Patient Instructions (Signed)
 Medicinal [place] Herbal Pills] Drue Plants Artemisia Annua Pills/??? ???? (Artemisia Annua/???)

## 2024-02-26 NOTE — Telephone Encounter (Signed)
 Copied from CRM [id number]. Topic: Clinical - Prescription Issue >> Feb 26, 2024 11:24 AM Berneda FALCON wrote: Reason for CRM: Corean from [facility] is calling in regards to : conjugated estrogens  (PREMARIN ) vaginal cream  Insurance requires grams-where it says 1 applicator-it needs to have .5, 1, 1.5 or 2 grams in order for the insurance to cover it  [id number]

## 2024-02-26 NOTE — Assessment & Plan Note (Signed)
 Continue with a multivitamin

## 2024-02-26 NOTE — Assessment & Plan Note (Signed)
Stable GFR 52-53 -- Stage 3a Cont to hydrate well

## 2024-02-26 NOTE — Assessment & Plan Note (Signed)
 Better  better  Use infrared light mat for Keloid, ankle pain

## 2024-02-26 NOTE — Progress Notes (Signed)
 Subjective:  Patient ID: Amanda Cole, female    DOB: [birth year]  Age: 83 y.o. MRN: [id number]  CC: Medical Management of Chronic Issues (3 Month follow up)   HPI Amanda Cole presents for Achilles tendon injury, CRI, insomnia  Outpatient Medications Prior to Visit  Medication Sig Dispense Refill   cephALEXin  (KEFLEX ) 500 MG capsule Take 1 capsule (500 mg total) by mouth 4 (four) times daily. 28 capsule 0   Cholecalciferol  (VITAMIN D3) 50 MCG (2000 UT) capsule Take 1 capsule (2,000 Units total) by mouth daily. 100 capsule 3   HYDROcodone -acetaminophen  (NORCO/VICODIN) 5-325 MG tablet Take 1 tablet by mouth every 6 (six) hours as needed for severe pain (pain score 7-10). 20 tablet 0   Multiple Vitamins-Minerals (ONE-A-DAY 50 PLUS PO) Take 1 tablet by mouth daily.     mupirocin  ointment (BACTROBAN ) 2 % On leg wound w/dressing change qd or bid 30 g 0   zolpidem  (AMBIEN ) 5 MG tablet Take 1 tablet (5 mg total) by mouth at bedtime as needed for sleep. 30 tablet 1   conjugated estrogens  (PREMARIN ) vaginal cream Use PV as directed (1 applicator Vaginal Daily PRN, Dryness) 42.5 g 3   No facility-administered medications prior to visit.    ROS: Review of Systems  Constitutional:  Negative for activity change, appetite change, chills, fatigue and unexpected weight change.  HENT:  Positive for congestion. Negative for mouth sores and sinus pressure.   Eyes:  Negative for visual disturbance.  Respiratory:  Negative for cough and chest tightness.   Gastrointestinal:  Negative for abdominal pain and nausea.  Genitourinary:  Negative for difficulty urinating, frequency and vaginal pain.  Musculoskeletal:  Positive for arthralgias, back pain and gait problem.  Skin:  Negative for pallor and rash.  Neurological:  Negative for dizziness, tremors, weakness, numbness and headaches.  Psychiatric/Behavioral:  Positive for sleep disturbance. Negative for confusion.     Objective:  BP 126/82    Pulse 67   Temp 98 F (36.7 C)   Ht 5' 3 (1.6 m)   Wt 129 lb 9.6 oz (58.8 kg)   SpO2 98%   BMI 22.96 kg/m   BP Readings from Last 3 Encounters:  02/26/24 126/82  11/19/23 118/70  10/22/23 114/78    Wt Readings from Last 3 Encounters:  02/26/24 129 lb 9.6 oz (58.8 kg)  11/19/23 129 lb (58.5 kg)  10/22/23 130 lb (59 kg)    Physical Exam Constitutional:      General: She is not in acute distress.    Appearance: She is well-developed. She is obese.  HENT:     Head: Normocephalic.     Right Ear: External ear normal.     Left Ear: External ear normal.     Nose: Nose normal.  Eyes:     General:        Right eye: No discharge.        Left eye: No discharge.     Conjunctiva/sclera: Conjunctivae normal.     Pupils: Pupils are equal, round, and reactive to light.  Neck:     Thyroid : No thyromegaly.     Vascular: No JVD.     Trachea: No tracheal deviation.  Cardiovascular:     Rate and Rhythm: Normal rate and regular rhythm.     Heart sounds: Normal heart sounds.  Pulmonary:     Effort: No respiratory distress.     Breath sounds: No stridor. No wheezing.  Abdominal:     General:  Bowel sounds are normal. There is no distension.     Palpations: Abdomen is soft. There is no mass.     Tenderness: There is no abdominal tenderness. There is no guarding or rebound.  Musculoskeletal:        General: Tenderness present.     Cervical back: Normal range of motion and neck supple. No rigidity.     Right lower leg: No edema.     Left lower leg: No edema.  Lymphadenopathy:     Cervical: No cervical adenopathy.  Skin:    Findings: No erythema or rash.  Neurological:     Mental Status: She is oriented to person, place, and time.     Cranial Nerves: No cranial nerve deficit.     Motor: No abnormal muscle tone.     Coordination: Coordination normal.     Gait: Gait normal.     Deep Tendon Reflexes: Reflexes normal.  Psychiatric:        Behavior: Behavior normal.        Thought  Content: Thought content normal.        Judgment: Judgment normal.   Hands w/OA Keloid scar is softer  Lab Results  Component Value Date   WBC 4.9 12/26/2022   HGB 13.3 12/26/2022   HCT 40.9 12/26/2022   PLT 176.0 12/26/2022   GLUCOSE 85 12/26/2022   CHOL 295 (H) 12/26/2022   TRIG 139.0 12/26/2022   HDL 54.60 12/26/2022   LDLDIRECT 161.5 03/29/2012   LDLCALC 213 (H) 12/26/2022   ALT 37 (H) 12/26/2022   AST 40 (H) 12/26/2022   NA 135 12/26/2022   K 4.0 12/26/2022   CL 103 12/26/2022   CREATININE 1.07 12/26/2022   BUN 18 12/26/2022   CO2 24 12/26/2022   TSH 1.89 12/26/2022    No results found.  Assessment & Plan:   Problem List Items Addressed This Visit     Achilles tendon injury, right, subsequent encounter - Primary   Better  better  Use infrared light mat for Keloid, ankle pain       Chronic renal insufficiency, stage 3 (moderate)   Stable GFR 52-53 -- Stage 3a Cont to hydrate well      Relevant Orders   TSH   Urinalysis   CBC with Differential/Platelet   Lipid panel   Comprehensive metabolic panel with GFR   Hand arthritis   Try Medicinal [place] Herbal Pills] [employer] Plants Artemisia Annua Pills/??? ???? (Artemisia Annua/???) Vit D      Relevant Orders   TSH   Urinalysis   CBC with Differential/Platelet   Lipid panel   Comprehensive metabolic panel with GFR   Keloid scar   Better       Vitamin B12 deficiency   Continue with a multivitamin      Relevant Orders   TSH   Urinalysis   CBC with Differential/Platelet   Lipid panel   Comprehensive metabolic panel with GFR      Meds ordered this encounter  Medications   conjugated estrogens  (PREMARIN ) vaginal cream    Sig: Use PV as directed (1 applicator Vaginal Daily PRN, Dryness)    Dispense:  42.5 g    Refill:  3      Follow-up: Return in about 6 months (around 08/26/2024).  Marolyn Noel, MD

## 2024-02-27 ENCOUNTER — Ambulatory Visit: Payer: Self-pay | Admitting: Internal Medicine

## 2024-02-29 MED ORDER — PREMARIN 0.625 MG/GM VA CREA: TOPICAL_CREAM | VAGINAL | 3 refills | Status: AC

## 2024-02-29 NOTE — Telephone Encounter (Signed)
Corrected. Thanks

## 2024-02-29 NOTE — Addendum Note (Signed)
 Addended by: Tari Lecount V on: 02/29/2024 12:07 PM   Modules accepted: Orders

## 2024-06-19 ENCOUNTER — Ambulatory Visit: Payer: Self-pay

## 2024-06-19 NOTE — Telephone Encounter (Signed)
 FYI Only or Action Required?: FYI only for provider: appointment scheduled on 2/6.  Patient was last seen in primary care on 02/26/2024 by Plotnikov, Karlynn GAILS, MD.  Called Nurse Triage reporting Sore Throat.  Symptoms began several days ago.  Interventions attempted: OTC medications: Vitamin C and Zinc.  Symptoms are: gradually worsening.  Triage Disposition: See Physician Within 24 Hours  Patient/caregiver understands and will follow disposition?: Yes    3-4 days ago onset 3/10 sore throat pain with swollen lymph nodes. Right earache, intermittent and severe. Lasts a few seconds. No fever. Small white spots on one side of throat. No SOB. Able to swallow liquids. Scheduled appt with PCP tomorrow.     Message from Antwanette L sent at 06/19/2024  9:55 AM EST  Reason for Triage: The pt reports a sore throat w/ no fever and swollen glands. She has been experiencing these symptoms for 3 or more days.   Reason for Disposition  [1] Pus on tonsils (back of throat) AND [2] fever AND [3] swollen neck lymph nodes (glands)  Answer Assessment - Initial Assessment Questions 1. ONSET: When did the throat start hurting? (Hours or days ago)      3-4 days ago  2. SEVERITY: How bad is the sore throat? (Scale 1-10; mild, moderate or severe)     3/10  3. STREP EXPOSURE: Has there been any exposure to strep within the past week? If Yes, ask: What type of contact occurred?      No  4.  VIRAL SYMPTOMS: Are there any symptoms of a cold, such as a runny nose, cough, hoarse voice or red eyes?      No  5. FEVER: Do you have a fever? If Yes, ask: What is your temperature, how was it measured, and when did it start?     No  6. PUS ON THE TONSILS: Is there pus on the tonsils in the back of your throat?     Small spots on one side of throat  7. OTHER SYMPTOMS: Do you have any other symptoms? (e.g., difficulty breathing, headache, rash)     Swollen neck lymph node, intermittent  severe right each ache  Protocols used: Sore Throat-A-AH

## 2024-06-20 ENCOUNTER — Ambulatory Visit: Admitting: Internal Medicine

## 2024-06-20 ENCOUNTER — Encounter: Payer: Self-pay | Admitting: Internal Medicine

## 2024-06-20 VITALS — BP 138/78 | HR 79 | Ht 63.0 in | Wt 129.8 lb

## 2024-06-20 DIAGNOSIS — J069 Acute upper respiratory infection, unspecified: Secondary | ICD-10-CM | POA: Insufficient documentation

## 2024-06-20 DIAGNOSIS — H9201 Otalgia, right ear: Secondary | ICD-10-CM

## 2024-06-20 DIAGNOSIS — J029 Acute pharyngitis, unspecified: Secondary | ICD-10-CM

## 2024-06-20 LAB — POC COVID19 BINAXNOW: SARS Coronavirus 2 Ag: NEGATIVE

## 2024-06-20 LAB — POCT RAPID STREP A (OFFICE): Rapid Strep A Screen: NEGATIVE

## 2024-06-20 MED ORDER — AZITHROMYCIN 250 MG PO TABS: ORAL_TABLET | ORAL | 0 refills | Status: AC

## 2024-06-20 NOTE — Progress Notes (Signed)
 "  Subjective:  Patient ID: Amanda Cole, female    DOB: 11-01-1940  Age: 84 y.o. MRN: [id number]  CC: Sore Throat (Sore throat, white spots on throat and swollen lymph nodes, right ear pain. Symptoms 3-4 days)   HPI [name] presents for sore throat, white spots on throat and swollen lymph nodes, right ear pain. Symptoms 3-4 days - not better  Outpatient Medications Prior to Visit  Medication Sig Dispense Refill   cephALEXin  (KEFLEX ) 500 MG capsule Take 1 capsule (500 mg total) by mouth 4 (four) times daily. 28 capsule 0   Cholecalciferol  (VITAMIN D3) 50 MCG (2000 UT) capsule Take 1 capsule (2,000 Units total) by mouth daily. 100 capsule 3   conjugated estrogens  (PREMARIN ) vaginal cream Use PV at night as directed (1 g via applicator twice a week) 42.5 g 3   HYDROcodone -acetaminophen  (NORCO/VICODIN) 5-325 MG tablet Take 1 tablet by mouth every 6 (six) hours as needed for severe pain (pain score 7-10). 20 tablet 0   Multiple Vitamins-Minerals (ONE-A-DAY 50 PLUS PO) Take 1 tablet by mouth daily.     mupirocin  ointment (BACTROBAN ) 2 % On leg wound w/dressing change qd or bid 30 g 0   zolpidem  (AMBIEN ) 5 MG tablet Take 1 tablet (5 mg total) by mouth at bedtime as needed for sleep. 30 tablet 1   No facility-administered medications prior to visit.    ROS: Review of Systems  Constitutional:  Negative for activity change, appetite change, chills, fatigue, fever and unexpected weight change.  HENT:  Positive for ear pain, sinus pressure and sore throat. Negative for congestion and mouth sores.   Eyes:  Negative for visual disturbance.  Respiratory:  Negative for cough and chest tightness.   Gastrointestinal:  Negative for abdominal pain and nausea.  Genitourinary:  Negative for difficulty urinating, frequency and vaginal pain.  Musculoskeletal:  Negative for back pain and gait problem.  Skin:  Negative for pallor and rash.  Neurological:  Negative for dizziness, tremors, weakness,  numbness and headaches.  Psychiatric/Behavioral:  Negative for confusion and sleep disturbance.     Objective:  BP 138/78   Pulse 79   Ht 5' 3 (1.6 m)   Wt 129 lb 12.8 oz (58.9 kg)   SpO2 95%   BMI 22.99 kg/m   BP Readings from Last 3 Encounters:  06/20/24 138/78  02/26/24 126/82  11/19/23 118/70    Wt Readings from Last 3 Encounters:  06/20/24 129 lb 12.8 oz (58.9 kg)  02/26/24 129 lb 9.6 oz (58.8 kg)  11/19/23 129 lb (58.5 kg)    Physical Exam Constitutional:      General: She is not in acute distress.    Appearance: She is well-developed.  HENT:     Head: Normocephalic.     Right Ear: External ear normal. Tympanic membrane is erythematous.     Left Ear: External ear normal. Tympanic membrane is erythematous.     Nose: Nose normal.     Mouth/Throat:     Pharynx: Posterior oropharyngeal erythema present.  Eyes:     General:        Right eye: No discharge.        Left eye: No discharge.     Conjunctiva/sclera: Conjunctivae normal.     Pupils: Pupils are equal, round, and reactive to light.  Neck:     Thyroid : No thyromegaly.     Vascular: No JVD.     Trachea: No tracheal deviation.  Cardiovascular:  Rate and Rhythm: Normal rate and regular rhythm.     Heart sounds: Normal heart sounds.  Pulmonary:     Effort: No respiratory distress.     Breath sounds: No stridor. No wheezing.  Abdominal:     General: Bowel sounds are normal. There is no distension.     Palpations: Abdomen is soft. There is no mass.     Tenderness: There is no abdominal tenderness. There is no guarding or rebound.  Musculoskeletal:        General: No tenderness.     Cervical back: Normal range of motion and neck supple. No rigidity.  Lymphadenopathy:     Cervical: No cervical adenopathy.  Skin:    Findings: No erythema or rash.  Neurological:     Cranial Nerves: No cranial nerve deficit.     Motor: No abnormal muscle tone.     Coordination: Coordination normal.     Deep Tendon  Reflexes: Reflexes normal.  Psychiatric:        Behavior: Behavior normal.        Thought Content: Thought content normal.        Judgment: Judgment normal.     Lab Results  Component Value Date   WBC 4.6 02/26/2024   HGB 13.6 02/26/2024   HCT 41.6 02/26/2024   PLT 203.0 02/26/2024   GLUCOSE 93 02/26/2024   CHOL 251 (H) 02/26/2024   TRIG 161.0 (H) 02/26/2024   HDL 50.10 02/26/2024   LDLDIRECT 161.5 03/29/2012   LDLCALC 168 (H) 02/26/2024   ALT 32 02/26/2024   AST 34 02/26/2024   NA 136 02/26/2024   K 4.7 02/26/2024   CL 103 02/26/2024   CREATININE 1.30 (H) 02/26/2024   BUN 17 02/26/2024   CO2 26 02/26/2024   TSH 2.08 02/26/2024    No results found.  Assessment & Plan:   Problem List Items Addressed This Visit     Upper respiratory infection - Primary   Zpac po Strep, COVID tests [place]      Relevant Medications   azithromycin  (ZITHROMAX  Z-PAK) 250 MG tablet   Other Visit Diagnoses       Sore throat       Relevant Orders   POC COVID-19 (Completed)   POCT rapid strep A (Completed)     Right ear pain       Relevant Orders   POC COVID-19 (Completed)   POCT rapid strep A (Completed)         Meds ordered this encounter  Medications   azithromycin  (ZITHROMAX  Z-PAK) 250 MG tablet    Sig: As directed    Dispense:  6 tablet    Refill:  0      Follow-up: No follow-ups on file.  Amanda Noel, MD "

## 2024-06-20 NOTE — Assessment & Plan Note (Signed)
 Zpac po Strep, COVID tests [place]

## 2024-06-20 NOTE — Patient Instructions (Signed)
 Ricola or [place]s lozinges DayQuil, NyQuil as needed

## 2024-08-26 ENCOUNTER — Ambulatory Visit: Admitting: Internal Medicine
